# Patient Record
Sex: Male | Born: 1955 | Race: White | Hispanic: No | Marital: Married | Smoking: Never smoker
Health system: Southern US, Community
[De-identification: ages and names within clinical notes are randomized; demographics above are authoritative.]

## PROBLEM LIST (undated history)

## (undated) DIAGNOSIS — K635 Polyp of colon: Secondary | ICD-10-CM

## (undated) DIAGNOSIS — M1990 Unspecified osteoarthritis, unspecified site: Secondary | ICD-10-CM

## (undated) DIAGNOSIS — F32A Depression, unspecified: Secondary | ICD-10-CM

## (undated) DIAGNOSIS — I10 Essential (primary) hypertension: Secondary | ICD-10-CM

## (undated) DIAGNOSIS — K219 Gastro-esophageal reflux disease without esophagitis: Secondary | ICD-10-CM

## (undated) DIAGNOSIS — F329 Major depressive disorder, single episode, unspecified: Secondary | ICD-10-CM

## (undated) HISTORY — DX: Major depressive disorder, single episode, unspecified: F32.9

## (undated) HISTORY — DX: Depression, unspecified: F32.A

## (undated) HISTORY — DX: Gastro-esophageal reflux disease without esophagitis: K21.9

## (undated) HISTORY — PX: BACK SURGERY: SHX140

## (undated) HISTORY — PX: OTHER SURGICAL HISTORY: SHX169

## (undated) HISTORY — PX: NECK SURGERY: SHX720

## (undated) HISTORY — DX: Unspecified osteoarthritis, unspecified site: M19.90

## (undated) HISTORY — DX: Polyp of colon: K63.5

## (undated) HISTORY — DX: Essential (primary) hypertension: I10

---
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
## 1988-02-17 HISTORY — PX: LUMBAR SPINE SURGERY: SHX701

## 1997-02-16 HISTORY — PX: OTHER SURGICAL HISTORY: SHX169

## 1997-11-11 ENCOUNTER — Ambulatory Visit (HOSPITAL_COMMUNITY): Admission: RE | Admit: 1997-11-11 | Discharge: 1997-11-11 | Payer: Self-pay | Admitting: Internal Medicine

## 1997-11-11 ENCOUNTER — Encounter: Payer: Self-pay | Admitting: Internal Medicine

## 1997-12-20 ENCOUNTER — Inpatient Hospital Stay (HOSPITAL_COMMUNITY): Admission: RE | Admit: 1997-12-20 | Discharge: 1997-12-21 | Payer: Self-pay | Admitting: Neurosurgery

## 1997-12-20 ENCOUNTER — Encounter: Payer: Self-pay | Admitting: Neurosurgery

## 1998-01-17 ENCOUNTER — Ambulatory Visit (HOSPITAL_COMMUNITY): Admission: RE | Admit: 1998-01-17 | Discharge: 1998-01-17 | Payer: Self-pay | Admitting: Neurosurgery

## 1998-01-17 ENCOUNTER — Encounter: Payer: Self-pay | Admitting: Neurosurgery

## 1998-02-28 ENCOUNTER — Encounter: Admission: RE | Admit: 1998-02-28 | Discharge: 1998-05-02 | Payer: Self-pay | Admitting: Internal Medicine

## 1998-06-16 ENCOUNTER — Ambulatory Visit (HOSPITAL_COMMUNITY): Admission: RE | Admit: 1998-06-16 | Discharge: 1998-06-16 | Payer: Self-pay | Admitting: Psychiatry

## 1999-01-19 ENCOUNTER — Encounter: Payer: Self-pay | Admitting: Internal Medicine

## 1999-01-19 ENCOUNTER — Ambulatory Visit (HOSPITAL_COMMUNITY): Admission: RE | Admit: 1999-01-19 | Discharge: 1999-01-19 | Payer: Self-pay | Admitting: Internal Medicine

## 1999-02-17 HISTORY — PX: LUMBAR SPINE SURGERY: SHX701

## 1999-02-18 ENCOUNTER — Encounter: Payer: Self-pay | Admitting: Neurosurgery

## 1999-02-20 ENCOUNTER — Inpatient Hospital Stay (HOSPITAL_COMMUNITY): Admission: RE | Admit: 1999-02-20 | Discharge: 1999-02-21 | Payer: Self-pay | Admitting: Neurosurgery

## 1999-02-20 ENCOUNTER — Encounter: Payer: Self-pay | Admitting: Neurosurgery

## 1999-03-10 ENCOUNTER — Encounter: Payer: Self-pay | Admitting: Neurosurgery

## 1999-03-10 ENCOUNTER — Ambulatory Visit (HOSPITAL_COMMUNITY): Admission: RE | Admit: 1999-03-10 | Discharge: 1999-03-10 | Payer: Self-pay | Admitting: Neurosurgery

## 1999-06-06 ENCOUNTER — Encounter: Payer: Self-pay | Admitting: Neurosurgery

## 1999-06-06 ENCOUNTER — Ambulatory Visit (HOSPITAL_COMMUNITY): Admission: RE | Admit: 1999-06-06 | Discharge: 1999-06-06 | Payer: Self-pay | Admitting: Neurosurgery

## 1999-06-30 ENCOUNTER — Encounter: Payer: Self-pay | Admitting: Neurosurgery

## 1999-07-02 ENCOUNTER — Encounter: Payer: Self-pay | Admitting: Neurosurgery

## 1999-07-02 ENCOUNTER — Inpatient Hospital Stay (HOSPITAL_COMMUNITY): Admission: RE | Admit: 1999-07-02 | Discharge: 1999-07-03 | Payer: Self-pay | Admitting: Neurosurgery

## 1999-07-25 ENCOUNTER — Encounter: Payer: Self-pay | Admitting: Neurosurgery

## 1999-07-25 ENCOUNTER — Encounter: Admission: RE | Admit: 1999-07-25 | Discharge: 1999-07-25 | Payer: Self-pay | Admitting: Neurosurgery

## 1999-09-23 ENCOUNTER — Encounter: Admission: RE | Admit: 1999-09-23 | Discharge: 1999-09-23 | Payer: Self-pay | Admitting: Neurosurgery

## 1999-09-23 ENCOUNTER — Encounter: Payer: Self-pay | Admitting: Neurosurgery

## 1999-10-13 ENCOUNTER — Encounter: Payer: Self-pay | Admitting: Neurosurgery

## 1999-10-13 ENCOUNTER — Encounter: Admission: RE | Admit: 1999-10-13 | Discharge: 1999-10-13 | Payer: Self-pay | Admitting: Neurosurgery

## 1999-12-18 ENCOUNTER — Encounter: Admission: RE | Admit: 1999-12-18 | Discharge: 1999-12-18 | Payer: Self-pay | Admitting: Neurosurgery

## 1999-12-18 ENCOUNTER — Encounter: Payer: Self-pay | Admitting: Neurosurgery

## 2000-06-05 ENCOUNTER — Ambulatory Visit (HOSPITAL_COMMUNITY): Admission: RE | Admit: 2000-06-05 | Discharge: 2000-06-05 | Payer: Self-pay | Admitting: Neurosurgery

## 2000-06-05 ENCOUNTER — Encounter: Payer: Self-pay | Admitting: Neurosurgery

## 2000-10-24 ENCOUNTER — Ambulatory Visit (HOSPITAL_COMMUNITY): Admission: RE | Admit: 2000-10-24 | Discharge: 2000-10-24 | Payer: Self-pay | Admitting: Neurosurgery

## 2000-10-24 ENCOUNTER — Encounter: Payer: Self-pay | Admitting: Neurosurgery

## 2001-04-19 ENCOUNTER — Encounter: Payer: Self-pay | Admitting: Internal Medicine

## 2001-04-19 ENCOUNTER — Encounter: Admission: RE | Admit: 2001-04-19 | Discharge: 2001-04-19 | Payer: Self-pay | Admitting: Internal Medicine

## 2001-07-07 ENCOUNTER — Encounter: Payer: Self-pay | Admitting: Gastroenterology

## 2001-07-07 ENCOUNTER — Ambulatory Visit (HOSPITAL_COMMUNITY): Admission: RE | Admit: 2001-07-07 | Discharge: 2001-07-07 | Payer: Self-pay | Admitting: Gastroenterology

## 2001-10-05 ENCOUNTER — Encounter: Payer: Self-pay | Admitting: *Deleted

## 2001-10-05 ENCOUNTER — Encounter: Admission: RE | Admit: 2001-10-05 | Discharge: 2001-10-05 | Payer: Self-pay | Admitting: *Deleted

## 2003-10-28 ENCOUNTER — Ambulatory Visit (HOSPITAL_COMMUNITY): Admission: RE | Admit: 2003-10-28 | Discharge: 2003-10-28 | Payer: Self-pay | Admitting: Neurosurgery

## 2005-12-26 ENCOUNTER — Encounter: Admission: RE | Admit: 2005-12-26 | Discharge: 2005-12-26 | Payer: Self-pay | Admitting: Family Medicine

## 2007-05-04 ENCOUNTER — Emergency Department (HOSPITAL_COMMUNITY): Admission: EM | Admit: 2007-05-04 | Discharge: 2007-05-04 | Payer: Self-pay | Admitting: Emergency Medicine

## 2008-06-21 ENCOUNTER — Encounter: Admission: RE | Admit: 2008-06-21 | Discharge: 2008-06-21 | Payer: Self-pay | Admitting: Anesthesiology

## 2010-03-08 ENCOUNTER — Encounter: Payer: Self-pay | Admitting: Neurosurgery

## 2010-07-04 NOTE — Op Note (Signed)
[place]  Patient:    Jeffrey Yang, Jeffrey Yang Visit Number: [id number] MRN: [id number]          Service Type: END Location: ENDO Attending Physician:  Orland Mustard Dictated by:   Llana Aliment. Randa Evens, M.D. Proc. Date: 07/07/01 Admit Date:  07/07/2001 Discharge Date: 07/07/2001   CC:         Lilla Shook, M.D.   Operative Report  PROCEDURE:  Esophagogastroduodenoscopy.  MEDICATIONS:  Fentanyl 75 mcg, Versed 7 mg IV.  SCOPE:  Olympus scope.  INDICATION:  The patient is being treated by Dr. Rosalene Billings for chronic spinal pain and chronic pain syndrome; on chronic narcotics, oxycontin and oxycodone. He has had alot of nausea, vague symptoms, and severe constipation. He has fairly marked up epigastric pain. This has not really responded to proton pump inhibitor. This procedure is done to look for another reason.  DESCRIPTION OF PROCEDURE:  Procedure had been explained to the patient and consent obtained. The patient in the left lateral decubitus position, the Olympus video scope was inserted. Due to the patients chronic narcotic use, it was difficult to get him stable or able to do that and have him swallow the scope. The stomach was entered. Immediately upon entering the stomach, the greater curve was obscured by retained food material. We were able to pass this, and the duodenum, including the bulb and second portion, were seen well. The port channel was normal. The antrum was seen well and was normal. The majority of the greater curve was seen and was normal. No ulceration. There was again retained food material despite an overnight fast. Fundus and cardia were seen well and were normal. There was a 2-cm hiatal hernia. The distal and proximal esophagus were seen well upon removal and were normal. The patient tolerated the procedure fairly well.  ASSESSMENT:  Epigastric pain; more than likely due to gastroparesis from severe narcotics  use.  PLAN:  We will add Reglan in hopes of improving gastric emptying to see if this will help, and see back in the office in 2 months. Dictated by:   Llana Aliment. Randa Evens, M.D. Attending Physician:  Orland Mustard DD:  07/07/01 TD:  07/09/01 Job: [id number]

## 2010-07-04 NOTE — H&P (Signed)
[place]  Patient:    Jeffrey Yang                          MRN: [id number] Adm. Date:  [id number] Attending:  Barton Fanny CC:         [name], M.D. at office                         History and Physical  HISTORY OF PRESENT ILLNESS:  Patient is a 55 year old right-handed white male who is evaluated for a right L4-5 lumbar disk herniation.  He has a long history of  difficulties with his neck and his back.  He has undergone a right L5-S1 lumbar  laminotomy and diskectomy by Dr. Corlis Hove in January 1990.  He underwent a C6-7 anterior cervical diskectomy and fusion by Dr. [name] in 1994 and a C5-6 anterior cervical diskectomy and fusion by Dr. [name] in November 1999.  The patient explains that, as regards his low back, he had experienced periodic  lumbar sacral strains ever since his lumbar surgery 11 years ago.  He did have ome chronic numbness in the lateral aspect of his right thigh following that surgery 11 years ago.  Overall, his low back condition has been stable, though, up until about six months ago, when he was at the beach playing Frisbee with his son, he  tumbled over, landing on his buttocks, and since then he has had pain in his low back, down through his right lower extremity.  He describes pain in the right side of his low back into the lateral right hip and into the anterolateral right thigh, as well as pain to the right posterior thigh and calf and into the right foot towards the right great toe.  He has been treated with a variety of medications, including Darvocet, Vicodin, Flexeril, Neurontin, but the radicular pain continues.  The patient notes that he continues to have chronic neck pain and really had no  clinical improvement following his second cervical fusion.  He was discharged from Dr. [name] care, and continues under the care of his primary physician, Dr. [name],  as well as having undergone evaluation with Dr. [name].  He has been treated with Paxil for depression over the past couple of years, which he has used intermittently.  He also uses Neurontin intermittently.  PAST MEDICAL HISTORY:  Notable for history of hypertension, which has been treated the past couple of months, but which has been monitored for quite some time. He has a history of depression, TMJ, and colitis.  There is no history of myocardial infarction, cancer, stroke, diabetes, or lung disease.  PREVIOUS SURGERY:  Includes a right L5-S1 lumbar laminotomy and diskectomy by Dr. [name] in January 1990; a herniorrhaphy and vasectomy by Dr. [name] in February 1992; a C6-7 ACDF by Dr. [name] in February 1994; biopsy of a lump in his mouth that was benign in January 1999; knee surgery with Dr. [name] in March 1999; a C5-6 ACDF by Dr. [name] in November 1999.  ALLERGIES:  He has no allergies to medications, but he says that CODEINE often makes him sick.  He does tolerate hydrocodone.  CURRENT MEDICATIONS: 1. Diltiazem ER 180 mg q.d. for hypertension. 2. Flexeril 10 mg two at night for muscle spasms in his sleep. 3. Paxil 30 mg q.d. for depression. 4. Neurontin 300 mg t.i.d. for headaches  and neck pain. 5. Hydrocodone 2 a day for pain not relieved by Darvocet N-100, which he takes    about 2 per day for pain.  FAMILY HISTORY:  His father died of a brain tumor, his mother is in fair health at age 31 with diabetes, COPD, and hypertension.  There is a family history of diabetes, hypertension, brain tumors, and stroke.  SOCIAL HISTORY:  Patient is married.  He works as a Environmental manager. He does not smoke.  He drinks alcoholic beverages socially.  He denies a history of substance abuse.  REVIEW OF SYSTEMS:  Notable for those difficulties described in his history of present illness and past medical history.  Also had some chronic tinnitus  and some mild difficulty with urinary function,  although no incontinence.  His review of systems otherwise unremarkable.  PHYSICAL EXAMINATION:  GENERAL:  Patient is a well-developed, well-nourished white male in no acute distress.  VITAL SIGNS:  His height is 5 feet 7.5 inches, weight 169 pounds.  He is afebrile.  LUNGS:  Clear to auscultation.  He has symmetrical respiratory excursion.  HEART:  Regular rate and rhythm, normal S1 and S2, there is no murmur.  ABDOMEN:  Soft, nondistended, bowel sounds present.  EXTREMITIES:  Show no clubbing, cyanosis, or edema.  MUSCULOSKELETAL:  Shows no tenderness to palpation of the lumbar spinous process or para lumbar musculature.  However, he does have discomfort with pulling in the posterior thighs, with flexion about 70 degrees.  He has some discomfort in the low back with extension.  Straight leg raising is negative bilaterally, but he does  produce some pulling in the right hamstrings at about 70-80 degrees.  NEUROLOGIC:  Shows 5/5 strength through the lower extremities including the iliopsoas, quadriceps, dorsiflexor, plantar flexor, and extensor hallucis longus. Sensation is intact to pinprick in both the upper and lower extremities. Reflexes show the biceps are minimal bilaterally.  The left brachioradialis is minimal, he right is trace.  The triceps are 1 bilaterally.  The quadriceps and gastrocnemii are 2 bilaterally.  Toes are downgoing bilaterally.  He has a normal gait and stance.  DIAGNOSTIC STUDIES:  AP and lateral lumbar spine x-rays show disk space narrowing and spurring at L4-5 and L5-S1, and to a lessor extent at L3-4, consistent with  degenerative disk disease.  MRI scan done at [facility] without and with gandolinium shows evidence of degenerative disk disease, multi-level, including  L3-4, L4-5, and L5-S1, with minimal degenerative changes at L2-3.  At L3-4 there is no significant ______ root  compression.  At L4-5, there is a disk herniation located in the right ventral epidural space with circumferential disk bulging and spondylitic ridging circumferentially.  At L5-S1, there is a previous right L5-S1  lumbar laminotomy and circumferential degenerative disk bulging and spondylitic  ridging circumferentially, but without distinct disk herniation.  IMPRESSION: 1.  Patient with chronic degenerative changes, both in neck and low back, with    chronic neck and low back pain, and difficulties with depression that is most    likely related to those to some extent. 2. Subacute right lumbar radiculopathy, primarily due to a right L4-5 lumbar disk    herniation.  PLAN:  Patient will be admitted for a right L4-5 lumbar laminotomy and microdiskectomy.  We discussed the goal of such a surgery, that being relief of his right lumbar radiculopathy and ______ through his right posterior thigh and calf and into the right foot and great toe.  However, his underlying neck pain and back pain most likely will persist.  We discussed the nature of the surgical procedure, typical length of surgery, hospital stay, and overall recuperation; and risks of surgery including the risk of infection, bleeding, possible need for transfusion; the risk of nerve root dysfunction with pain, weakness, numbness, or paresthesias; the risk of recurrent disk herniation; possible need for further surgery; and anesthetic risks of myocardial infarction, stroke, pneumonia, and death. Understanding all this, they do wish to proceed with surgery.  The patient is also scheduled for an appointment next month with Dr. Thyra Breed at the pain management clinic at [facility] and I encouraged him to keep that appointment so that they will be able to work on his chronic pain syndrome  involving his neck and back. DD:  02/20/99 TD:  02/20/99 Job: [id number]

## 2010-07-04 NOTE — Op Note (Signed)
[place]  Patient:    Jeffrey Yang, Jeffrey Yang                         MRN: [id number] Proc. Date: 07/02/99 Adm. Date:  [id number] Disc. Date: [id number] Attending:  Barton Fanny CC:         [name], M.D.                           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  OPERATION PERFORMED:  C5 to C7 posterior cervical arthrodesis with Axis lateral mass plates and secure strand interspinous wiring and Osteoblast allograft.  SURGEON:  [name], M.D.  ASSISTANT:  Danae Orleans. Venetia Maxon, M.D.  ANESTHESIA:  General endotracheal.  INDICATIONS FOR PROCEDURE:  The patient is a 55 year old man who has previously undergone a C6-7 anterior cervical diskectomy and fusion and then a C5-6 anterior cervical diskectomy and fusion, both by another physician and both which resulted in nonunion and pseudoarthrososis with chronic neck pain. A decision was then made to proceed with a posterior cervical arthrodesis.  DESCRIPTION OF PROCEDURE:  The patient was brought to the operating room and placed under general endotracheal anesthesia.  The patient was placed in a three-pin Mayfield headholder and turned to a prone position.  The surgical area was shaved and then prepped with Betadine soap and solution and draped in sterile fashion.  The midline was infiltrated with local anesthetic with epinephrine and a midline incision was made and carried down through the subcutaneous tissues.  Bipolar cautery and electrocautery were used to maintain hemostasis.  Dissection was carried down to the posterior cervical fascia which was incised bilaterally and the paracervical musculature was dissected in a subperiosteal fashion.  Self-retaining retractors were placed and x-ray was taken and the C5, C6 and C7 spinous processes and laminae  were identified.  Dissection was carried out laterally exposing the C5-6 and C6-7 facet joints bilaterally.  The facet joints were scraped clean of soft tissue using microcurets and then using the A-2 Midas Rex drill, the cartilaginous surfaces were cleaned of cartilaged and again the cartilaginous surfaces were further curetted using microcurets.  We then selected a six-hole 15 mm Axis plate.  It was cut in half and contoured to the cervical lordosis.  Using the Midas Rex drill with a dental attachment, we were able to make holes through the spinous processes of C5, 6 and 7 and then the lateral mass plates were positioned over the lateral masses bilaterally using an awl.  A pilot hole was placed 1 mm medial to the midpoint of the facet and then using the drill guide and the drill fit at 14 mm, screw holes were placed in each of the lateral masses in a trajectory 30 degrees superiorly and 30 degrees laterally.  The cortical surfaces were tapped and then 14 mm cancellous screws placed.  The plates were secured down and all six screws were tightened.  An x-ray was taken.  The alignment was good placing screws in good position.  Then using secure strand with an interspinous needle, we were able to pass a secure strand through each of the spinous processes of C5, C6 and C7 in a figure-of-eight fashion and they were then secured with tensioner and a secure knot placed.  Prior to placing the  lateral mass plates each of the facet joints was packed with Osteoblast gel and then after the fusion construct was completed, additional Osteoblast gel was placed over the lamina and facets after they had been decorticated with an A-2 bur.  The wound was then closed in multiple layers after hemostasis was established.  The deep fascia was closed with interrupted undyed 0 Vicryl sutures and subcutaneous and subcuticular layer were closed with interrupted undyed 0 and 2-0 undyed Vicryl sutures.  The skin edges  were approximated with Dermabond.  Following surgery, the patient was turned back to a supine position and the three pin Mayfield head holder was removed.  The patient was to be reversed from anesthetic, extubated and transferred to recovery room for further care.  Estimated blood loss for this procedure was 75 cc.  Sponge count correct. DD:  07/02/99 TD:  07/07/99 Job: [id number]

## 2010-07-04 NOTE — H&P (Signed)
[place]. [facility]  Patient:    Jeffrey Yang, Jeffrey Yang                         MRN: [id number] Adm. Date:  [id number] Attending:  Barton Fanny CC:         [name], M.D.                         History and Physical  CHIEF COMPLAINT: The patient is a 55 year old right-handed white male who has been a patient of mine for a number of years, admitted now for failed C5-6 and C6-7 anterior cervical diskectomy and fusions with development of pseudoarthrosis and chronic neck pain.  He is admitted now for posterior cervical fusion.  HISTORY OF PRESENT ILLNESS: His history dates back to January 1990 when he underwent right L5-S1 lumbar laminotomy and diskectomy by Dr. Corlis Hove.  He did well from that surgery.  Subsequently he came under the care of Dr. Aliene Beams because of insurance considerations and the patient underwent a C6-7 anterior cervical diskectomy and fusion in 1994. Unfortunately, he had enduring neck pain and subsequently underwent a C5-6 anterior cervical diskectomy and fusion by Dr. [name] in November of 1999. Notably, following that surgery he continued to have neck pain.  He was promptly released by Dr. [name], having been told that there was nothing further that could be done for him.  His primary physician, Dr. [name], then referred him to Dr. [name], and he has been under treatment for depression and chronic pain since that time, and currently has been waiting nearly a year for an appointment for pain management with Dr. Thyra Breed.  His insurance carrier changed and he returned or care in our practice in December 2000, with a subacute right lumbar radiculopathy that was found to be due to a right L4-5 lumbar disk herniation, and he underwent a right L4-5 lumbar laminotomy and microdiskectomy in January 2001, from which he recovered well.  However, he has continued to have chronic neck pain and  therefore his cervical spine was evaluated.  Cervical spine x-rays with flexion and extension views revealed that in fact he had a nonunion and pseudoarthrosis at both the C5-6 and C6-7 levels.  This was evidenced by splaying of the spinous processes between flexion and extension and fracture of the two screws at the lower end of the C5-6 anterior cervical plate; i.e., the screws in the C6 vertebra.  It was felt that the patient had a nonunion and pseudoarthrosis at both the C5-6 and C6-7 levels.  He was evaluated further with cervical MRI scan and cervical myelogram and post myelogram CT scan.  There were degenerative changes seen in the cervical spine but there was no significant thecal sac, spinal cord, or nerve root compression at any level.  The decision was made to proceed with a posterior cervical arthrodesis, specifically a C5 to C7 posterior cervical arthrodesis with axis plates, lateral mass screws, allograft, and intraspinous fixation, and the patient is now admitted for such.  PAST MEDICAL HISTORY:  1. Notable for a history of hypertension.  Medications have been adjusted by     Dr. Prentiss Bells over the past several months.  2. History of depression due to chronic pain.  3. TMJ syndrome.  4. Colitis.  He has no history of myocardial infarction, cancer, stroke, diabetes, or lung disease.  PAST SURGICAL HISTORY:  1.  Right L5-S1 lumbar laminotomy and diskectomy in January 1990.  2. Herniorrhaphy and vasectomy in February 1992.  3. C6-7 ACDF in February 1994.  4. Biopsy of lump in the mouth that was benign in January 1999.  5. Knee surgery in March 1999.  6. C5-6 ACDF in November of 1999.  7. Right L4-5 lumbar laminotomy and microdiskectomy in January 2001.  ALLERGIES: He has no known allergies but says that codeine often makes him sick.  He does tolerate hydrocodone.  CURRENT MEDICATIONS:  1. Paxil 30 mg q.d.  2. Hyzaar 50 mg q.d.  3. Temazepam q.h.s. p.r.n. for  sleep.  4. Hydrocodone for pain.  FAMILY HISTORY: Father died of a brain tumor.  His mother is in fair health at age 8 with diabetes, COPD, and hypertension.  There is a family history of diabetes, hypertension, brain tumors, and strokes.  SOCIAL HISTORY: The patient is married.  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  He does not smoke.  He drinks alcoholic beverages socially.  He denies history of substance abuse.  REVIEW OF SYSTEMS: Notable for those difficulties described in his History of Present Illness and Past Medical History.  He also notes a history of chronic tinnitus, some mild difficulties with urinary function but no incontinence. His Review Of Systems is otherwise unremarkable.  PHYSICAL EXAMINATION:  GENERAL: The patient is a well-developed, well-nourished white male in no acute distress.  VITAL SIGNS: Temperature 97.5 degrees, pulse 89, blood pressure 115/74, respiratory rate 18.  Height 5 feet 7 inches.  Weight 175 pounds.  LUNGS: Clear to auscultation with symmetrical respiratory excursion.  HEART: Regular rate and rhythm with normal S1 and S2.  No murmur.  ABDOMEN: Soft, nondistended.  Bowel sounds present.  EXTREMITIES: No clubbing, cyanosis, or edema.  NEUROLOGIC: Strength is 5/5 in the upper and lower extremities.  Sensation is intact to pinprick throughout the upper and lower extremities.  Reflexes at the biceps are minimal bilaterally, the left brachial radialis is minimal and the right is trace, the triceps are 1 bilaterally, the quadriceps and gastrocnemius are 2 bilaterally.  Toes are downgoing bilaterally.  He has normal gait  and stance.  DIAGNOSTIC STUDIES: Cervical spine x-rays with flexion and extension views  show splaying of the C5-6 and C6-7 spinous processes, with fracture of the inferior screws of the C5-6 plate; that is, the screws in the C6 vertebra. MRI, myelogram, and post myelogram CT scans do not show evidence of thecal sac, spinal cord, or nerve root compression at any level.  IMPRESSION: Failed fusion at C5-6 and C6-7 with nonunion and pseudoarthrosis and chronic neck pain.  PLAN: The patient is being admitted for a posterior cervical fusion, specifically a C5 to C7 posterior cervical arthrodesis with axis plates, lateral mass screws, and allograft with intraspinous fixation.  I have discussed with the patient and his wife on several occasions the nature of the surgical procedure, typical length of surgery, hospital stay, and overall recuperation, limitations during the postoperative period, and risks of surgery including risk of infection, bleeding, possible need for transfusion, risk of nerve root dysfunction, pain, weakness, numbness, or paresthesias, risk of  nerve root dysfunction with spinal cord dysfunction with weakness, numbness, or pain, and paralysis, as well as risk of failure with arthrodesis and nonunion, and anesthetic risks of myocardial infarction, stroke, pneumonia, and death.  Understanding all this the patient does wish to proceed with surgery, and is admitted for such. DD:  07/02/99 TD:  07/02/99 Job: [id number]

## 2014-02-26 DIAGNOSIS — I10 Essential (primary) hypertension: Secondary | ICD-10-CM | POA: Diagnosis not present

## 2014-02-26 DIAGNOSIS — R739 Hyperglycemia, unspecified: Secondary | ICD-10-CM | POA: Diagnosis not present

## 2014-03-12 DIAGNOSIS — I10 Essential (primary) hypertension: Secondary | ICD-10-CM | POA: Diagnosis not present

## 2014-03-12 DIAGNOSIS — R739 Hyperglycemia, unspecified: Secondary | ICD-10-CM | POA: Diagnosis not present

## 2014-09-03 DIAGNOSIS — R739 Hyperglycemia, unspecified: Secondary | ICD-10-CM | POA: Diagnosis not present

## 2014-09-03 DIAGNOSIS — Z0001 Encounter for general adult medical examination with abnormal findings: Secondary | ICD-10-CM | POA: Diagnosis not present

## 2014-09-03 DIAGNOSIS — I10 Essential (primary) hypertension: Secondary | ICD-10-CM | POA: Diagnosis not present

## 2014-09-10 DIAGNOSIS — Z0000 Encounter for general adult medical examination without abnormal findings: Secondary | ICD-10-CM | POA: Diagnosis not present

## 2014-09-10 DIAGNOSIS — Z125 Encounter for screening for malignant neoplasm of prostate: Secondary | ICD-10-CM | POA: Diagnosis not present

## 2015-03-12 DIAGNOSIS — I10 Essential (primary) hypertension: Secondary | ICD-10-CM | POA: Diagnosis not present

## 2015-03-12 DIAGNOSIS — Z125 Encounter for screening for malignant neoplasm of prostate: Secondary | ICD-10-CM | POA: Diagnosis not present

## 2015-03-15 DIAGNOSIS — R739 Hyperglycemia, unspecified: Secondary | ICD-10-CM | POA: Diagnosis not present

## 2015-03-15 DIAGNOSIS — E785 Hyperlipidemia, unspecified: Secondary | ICD-10-CM | POA: Diagnosis not present

## 2015-03-15 DIAGNOSIS — F1120 Opioid dependence, uncomplicated: Secondary | ICD-10-CM | POA: Diagnosis not present

## 2015-03-15 DIAGNOSIS — I10 Essential (primary) hypertension: Secondary | ICD-10-CM | POA: Diagnosis not present

## 2015-03-15 DIAGNOSIS — Z5181 Encounter for therapeutic drug level monitoring: Secondary | ICD-10-CM | POA: Diagnosis not present

## 2015-05-16 DIAGNOSIS — M25561 Pain in right knee: Secondary | ICD-10-CM | POA: Diagnosis not present

## 2015-09-06 DIAGNOSIS — I10 Essential (primary) hypertension: Secondary | ICD-10-CM | POA: Diagnosis not present

## 2015-10-17 DIAGNOSIS — R739 Hyperglycemia, unspecified: Secondary | ICD-10-CM | POA: Diagnosis not present

## 2015-10-17 DIAGNOSIS — Z125 Encounter for screening for malignant neoplasm of prostate: Secondary | ICD-10-CM | POA: Diagnosis not present

## 2015-10-17 DIAGNOSIS — Z23 Encounter for immunization: Secondary | ICD-10-CM | POA: Diagnosis not present

## 2015-10-17 DIAGNOSIS — I10 Essential (primary) hypertension: Secondary | ICD-10-CM | POA: Diagnosis not present

## 2016-12-01 DIAGNOSIS — M5412 Radiculopathy, cervical region: Secondary | ICD-10-CM | POA: Diagnosis not present

## 2016-12-01 DIAGNOSIS — M6208 Separation of muscle (nontraumatic), other site: Secondary | ICD-10-CM | POA: Diagnosis not present

## 2016-12-01 DIAGNOSIS — Z23 Encounter for immunization: Secondary | ICD-10-CM | POA: Diagnosis not present

## 2016-12-18 DIAGNOSIS — M5412 Radiculopathy, cervical region: Secondary | ICD-10-CM | POA: Diagnosis not present

## 2016-12-18 DIAGNOSIS — M5030 Other cervical disc degeneration, unspecified cervical region: Secondary | ICD-10-CM | POA: Diagnosis not present

## 2016-12-18 DIAGNOSIS — R29898 Other symptoms and signs involving the musculoskeletal system: Secondary | ICD-10-CM | POA: Diagnosis not present

## 2016-12-18 DIAGNOSIS — Z981 Arthrodesis status: Secondary | ICD-10-CM | POA: Diagnosis not present

## 2016-12-18 DIAGNOSIS — M4722 Other spondylosis with radiculopathy, cervical region: Secondary | ICD-10-CM | POA: Diagnosis not present

## 2016-12-24 DIAGNOSIS — M4802 Spinal stenosis, cervical region: Secondary | ICD-10-CM | POA: Diagnosis not present

## 2016-12-24 DIAGNOSIS — M5412 Radiculopathy, cervical region: Secondary | ICD-10-CM | POA: Diagnosis not present

## 2016-12-30 DIAGNOSIS — Z981 Arthrodesis status: Secondary | ICD-10-CM | POA: Diagnosis not present

## 2016-12-30 DIAGNOSIS — R29898 Other symptoms and signs involving the musculoskeletal system: Secondary | ICD-10-CM | POA: Diagnosis not present

## 2016-12-30 DIAGNOSIS — M4722 Other spondylosis with radiculopathy, cervical region: Secondary | ICD-10-CM | POA: Diagnosis not present

## 2016-12-30 DIAGNOSIS — M5412 Radiculopathy, cervical region: Secondary | ICD-10-CM | POA: Diagnosis not present

## 2016-12-30 DIAGNOSIS — G5622 Lesion of ulnar nerve, left upper limb: Secondary | ICD-10-CM | POA: Diagnosis not present

## 2017-11-22 DIAGNOSIS — Z79899 Other long term (current) drug therapy: Secondary | ICD-10-CM | POA: Diagnosis not present

## 2017-11-22 DIAGNOSIS — M5030 Other cervical disc degeneration, unspecified cervical region: Secondary | ICD-10-CM | POA: Diagnosis not present

## 2017-11-22 DIAGNOSIS — I10 Essential (primary) hypertension: Secondary | ICD-10-CM | POA: Diagnosis not present

## 2017-11-22 DIAGNOSIS — G4700 Insomnia, unspecified: Secondary | ICD-10-CM | POA: Diagnosis not present

## 2017-12-22 ENCOUNTER — Encounter: Payer: Self-pay | Admitting: Family Medicine

## 2017-12-22 ENCOUNTER — Ambulatory Visit (INDEPENDENT_AMBULATORY_CARE_PROVIDER_SITE_OTHER): Payer: Medicare Other

## 2017-12-22 ENCOUNTER — Ambulatory Visit (INDEPENDENT_AMBULATORY_CARE_PROVIDER_SITE_OTHER): Payer: Medicare Other | Admitting: Family Medicine

## 2017-12-22 ENCOUNTER — Encounter: Payer: Self-pay | Admitting: Lab

## 2017-12-22 VITALS — BP 132/84 | HR 78 | Temp 98.6°F | Ht 67.0 in | Wt 187.0 lb

## 2017-12-22 DIAGNOSIS — M19071 Primary osteoarthritis, right ankle and foot: Secondary | ICD-10-CM | POA: Diagnosis not present

## 2017-12-22 DIAGNOSIS — I10 Essential (primary) hypertension: Secondary | ICD-10-CM | POA: Diagnosis not present

## 2017-12-22 DIAGNOSIS — M542 Cervicalgia: Secondary | ICD-10-CM

## 2017-12-22 DIAGNOSIS — M79671 Pain in right foot: Secondary | ICD-10-CM | POA: Diagnosis not present

## 2017-12-22 DIAGNOSIS — M79672 Pain in left foot: Secondary | ICD-10-CM | POA: Diagnosis not present

## 2017-12-22 DIAGNOSIS — M19072 Primary osteoarthritis, left ankle and foot: Secondary | ICD-10-CM | POA: Diagnosis not present

## 2017-12-22 DIAGNOSIS — M5442 Lumbago with sciatica, left side: Secondary | ICD-10-CM | POA: Diagnosis not present

## 2017-12-22 DIAGNOSIS — G4700 Insomnia, unspecified: Secondary | ICD-10-CM | POA: Diagnosis not present

## 2017-12-22 DIAGNOSIS — G8929 Other chronic pain: Secondary | ICD-10-CM

## 2017-12-22 DIAGNOSIS — M5441 Lumbago with sciatica, right side: Secondary | ICD-10-CM

## 2017-12-22 DIAGNOSIS — M545 Low back pain, unspecified: Secondary | ICD-10-CM | POA: Insufficient documentation

## 2017-12-22 MED ORDER — CLONAZEPAM 0.5 MG PO TABS
0.5000 mg | ORAL_TABLET | Freq: Every day | ORAL | 2 refills | Status: DC
Start: 2017-12-22 — End: 2018-03-24

## 2017-12-22 NOTE — Progress Notes (Signed)
Subjective:    Patient ID: Jeffrey Yang, male    DOB: 1955/09/29, 62 y.o.   MRN: [id number]  HPI   Patient presents to clinic to establish primary care with new PCP.  He recently moved to the area from [place].  Patient takes medication to control blood pressure.  He also has chronic neck low back and leg pain.  Patient has had multiple back surgeries.  Uses meloxicam and Flexeril to help keep back, neck and leg pain under control.  Patient has taken clonazepam 0.5 mg once daily for many years to help him sleep.  Patient's main complaint today is bilateral foot pain with knot-like areas on the bottoms of both feet.  Patient states the pain in the feet has been present for months.  Patient did have blood work last month at his previous PCP in [place], we will have him sign medical release so we can get these records.  Past Medical History:  Diagnosis Date  . Arthritis   . Depression   . GERD (gastroesophageal reflux disease)   . Hypertension   . Polyp of colon    Social History   Tobacco Use  . Smoking status: Never Smoker  . Smokeless tobacco: Never Used  Substance Use Topics  . Alcohol use: Yes   Past Surgical History:  Procedure Laterality Date  . OTHER SURGICAL HISTORY    . TONSILLECTOMY  1965   Family History  Problem Relation Age of Onset  . Hypertension Father    Review of Systems  Constitutional: Negative for chills, fatigue and fever.  HENT: Negative for congestion, ear pain, sinus pain and sore throat.   Eyes: Negative.   Respiratory: Negative for cough, shortness of breath and wheezing.   Cardiovascular: Negative for chest pain, palpitations and leg swelling.  Gastrointestinal: Negative for abdominal pain, diarrhea, nausea and vomiting.  Genitourinary: Negative for dysuria, frequency and urgency.  Musculoskeletal: Chronic neck, back, leg pain. Pain on bottoms of both feet.  Skin: Negative for color change, pallor and rash.  Neurological: Negative  for syncope, light-headedness and headaches.  Psychiatric/Behavioral: The patient is not nervous/anxious.       Objective:   Physical Exam  Constitutional: He appears well-developed and well-nourished. No distress.  HENT: Normocephalic. Conjunctivae and EOM are normal. No scleral icterus.  Neck: Stiff ROM related to pain.Neck supple. No tracheal deviation present.  Cardiovascular: Normal rate, regular rhythm and normal heart sounds.  Pulmonary/Chest: Effort normal and breath sounds normal. No respiratory distress. He has no wheezes. He has no rales.  Abdominal: Soft. Bowel sounds are normal. There is no tenderness.  Neurological: He is alert and oriented to person, place, and time. Gait normal  Musculoskeletal: Pain on bottoms of both feet.  There are small knot-like areas on bottoms of both feet - ?nodule/fibroma Skin: Skin is warm and dry. He is not diaphoretic. No pallor.  Psychiatric: He has a normal mood and affect. His behavior is normal. Thought content normal.  Nursing note and vitals reviewed.    Vitals:   12/22/17 1008  BP: 132/84  Pulse: 78  Temp: 98.6 F (37 C)  SpO2: 93%   Assessment & Plan:   Bilateral foot pain-we will get x-rays of both feet to further investigate pain and not areas.  Once we have x-ray results, we will better be able to determine next step in plan of care.  Discussed possible referral to podiatry.  Insomnia-refill of clonazepam given.  [facility] PMP registry checked and  is appropriate for refill.  Patient also signed narcotic agreement and this will be scanned into chart.  Essential hypertension-blood pressure controlled on current medication regimen.  We will continue to monitor blood pressure readings.  Chronic low back and chronic neck pain- patient will continue meloxicam and cyclobenzaprine to help keep pain under control.  Follow-up here in 3 months for recheck on chronic conditions.  We will plan to do new blood work at next  visit.

## 2017-12-23 ENCOUNTER — Encounter: Payer: Self-pay | Admitting: *Deleted

## 2017-12-23 ENCOUNTER — Telehealth: Payer: Self-pay | Admitting: Family Medicine

## 2017-12-23 ENCOUNTER — Encounter: Payer: Self-pay | Admitting: Family Medicine

## 2017-12-23 NOTE — Telephone Encounter (Signed)
Left message on voicemail for pt to return call to office for results.    

## 2017-12-23 NOTE — Telephone Encounter (Signed)
Pt returning call for lab results  

## 2017-12-23 NOTE — Telephone Encounter (Signed)
This encounter was created in error - please disregard.

## 2017-12-23 NOTE — Telephone Encounter (Signed)
Copied from [place] [id number]. Topic: Quick Communication - Lab Results (Clinic Use ONLY) >> Dec 23, 2017 11:30 AM Neta Ehlers, RMA wrote: Called patient to inform them of 12/22/2017 lab results. When patient returns call, triage nurse may disclose results.

## 2018-03-24 ENCOUNTER — Ambulatory Visit (INDEPENDENT_AMBULATORY_CARE_PROVIDER_SITE_OTHER): Payer: Medicare Other | Admitting: Family Medicine

## 2018-03-24 ENCOUNTER — Encounter: Payer: Self-pay | Admitting: Family Medicine

## 2018-03-24 VITALS — BP 124/76 | HR 74 | Temp 98.0°F | Resp 16 | Ht 67.0 in | Wt 187.0 lb

## 2018-03-24 DIAGNOSIS — I10 Essential (primary) hypertension: Secondary | ICD-10-CM

## 2018-03-24 DIAGNOSIS — M79671 Pain in right foot: Secondary | ICD-10-CM

## 2018-03-24 DIAGNOSIS — Z1159 Encounter for screening for other viral diseases: Secondary | ICD-10-CM

## 2018-03-24 DIAGNOSIS — M79672 Pain in left foot: Secondary | ICD-10-CM | POA: Diagnosis not present

## 2018-03-24 DIAGNOSIS — G4700 Insomnia, unspecified: Secondary | ICD-10-CM

## 2018-03-24 LAB — COMPREHENSIVE METABOLIC PANEL
ALT: 16 U/L (ref 0–53)
AST: 17 U/L (ref 0–37)
Albumin: 4.5 g/dL (ref 3.5–5.2)
Alkaline Phosphatase: 84 U/L (ref 39–117)
BUN: 15 mg/dL (ref 6–23)
CHLORIDE: 103 meq/L (ref 96–112)
CO2: 28 mEq/L (ref 19–32)
Calcium: 9.7 mg/dL (ref 8.4–10.5)
Creatinine, Ser: 1.21 mg/dL (ref 0.40–1.50)
GFR: 60.67 mL/min (ref >60.00)
GLUCOSE: 136 mg/dL — AB (ref 70–99)
Potassium: 4.7 mEq/L (ref 3.5–5.1)
Sodium: 137 mEq/L (ref 135–145)
TOTAL PROTEIN: 7.3 g/dL (ref 6.0–8.3)
Total Bilirubin: 0.6 mg/dL (ref 0.2–1.2)

## 2018-03-24 LAB — CBC
HEMATOCRIT: 49.6 % (ref 39.0–52.0)
HEMOGLOBIN: 17.3 g/dL — AB (ref 13.0–17.0)
MCHC: 35 g/dL (ref 30.0–36.0)
MCV: 92.8 fl (ref 78.0–100.0)
PLATELETS: 282 10*3/uL (ref 150.0–400.0)
RBC: 5.34 Mil/uL (ref 4.22–5.81)
RDW: 13.3 % (ref 11.5–15.5)
WBC: 8.8 10*3/uL (ref 4.0–10.5)

## 2018-03-24 LAB — LIPID PANEL
CHOL/HDL RATIO: 6
Cholesterol: 210 mg/dL — ABNORMAL HIGH (ref 0–200)
HDL: 37.5 mg/dL — ABNORMAL LOW (ref >39.00)
NONHDL: 172.16
Triglycerides: 217 mg/dL — ABNORMAL HIGH (ref 0.0–149.0)
VLDL: 43.4 mg/dL — AB (ref 0.0–40.0)

## 2018-03-24 LAB — LDL CHOLESTEROL, DIRECT: Direct LDL: 140 mg/dL

## 2018-03-24 LAB — HEMOGLOBIN A1C: HEMOGLOBIN A1C: 5.9 % (ref 4.6–6.5)

## 2018-03-24 MED ORDER — CLONAZEPAM 0.5 MG PO TABS: 0.5000 mg | ORAL_TABLET | Freq: Every day | ORAL | 2 refills | Status: DC

## 2018-03-24 NOTE — Progress Notes (Signed)
Subjective:    Patient ID: Jeffrey Yang, male    DOB: 12-10-1955, 63 y.o.   MRN: [id number]  HPI   Patient presents to clinic for 99-month follow-up.  Blood pressure remains stable on Bystolic and amlodipine.  Is not having any adverse effects of these medications.  Denies any chest pain, palpitations or lower extremity swelling.  Patient continues to have issues with insomnia.  He has been on clonazepam 0.5 mg once daily prior to bedtime for over 20 years.  Patient states without this medication, feels like he cannot get to sleep.  States previous providers have tried to wean him down in the past, but he was always unsuccessful.  Patient continues to have bilateral foot pain.  X-rays done at previous visit did show some arthritis.  Patient tried anti-inflammatory medications without much effect.    Patient Active Problem List   Diagnosis Date Noted  . Essential hypertension 12/22/2017  . Foot pain, bilateral 12/22/2017  . Insomnia 12/22/2017  . Chronic bilateral low back pain with bilateral sciatica 12/22/2017  . Chronic neck pain 12/22/2017   Social History   Tobacco Use  . Smoking status: Never Smoker  . Smokeless tobacco: Never Used  Substance Use Topics  . Alcohol use: Yes   Review of Systems  Constitutional: Negative for chills, fatigue and fever.  HENT: Negative for congestion, ear pain, sinus pain and sore throat.   Eyes: Negative.   Respiratory: Negative for cough, shortness of breath and wheezing.   Cardiovascular: Negative for chest pain, palpitations and leg swelling.  Gastrointestinal: Negative for abdominal pain, diarrhea, nausea and vomiting.  Genitourinary: Negative for dysuria, frequency and urgency.  Musculoskeletal: +bilat foot pain  Skin: Negative for color change, pallor and rash.  Neurological: Negative for syncope, light-headedness and headaches.  Psychiatric/Behavioral: The patient is not nervous/anxious. +insomnia      Objective:   Physical  Exam  Constitutional: He appears well-developed and well-nourished. No distress.  HENT:  Head: Normocephalic and atraumatic.  Eyes: Pupils are equal, round, and reactive to light. Conjunctivae and EOM are normal. No scleral icterus.  Neck: Normal range of motion. Neck supple. No tracheal deviation present.  Cardiovascular: Normal rate, regular rhythm and normal heart sounds. No LE edema. No carotid bruit.  Pulmonary/Chest: Effort normal and breath sounds normal. No respiratory distress. He has no wheezes. He has no rales.  Neurological: He is alert and oriented to person, place, and time.  Gait normal  Skin: Skin is warm and dry. He is not diaphoretic. No pallor.  Psychiatric: He has a normal mood and affect. His behavior is normal.   Nursing note and vitals reviewed.   Vitals:   03/24/18 1001  BP: 124/76  Pulse: 74  Resp: 16  Temp: 98 F (36.7 C)  SpO2: 98%      Assessment & Plan:    Essential hypertension-stable on amlodipine and Bystolic.  Discussed healthy diet including lean proteins, vegetables, low carb low sugar lots of water monitoring salt intake and regular exercise.  Insomnia-patient has been on clonazepam for many years without adverse effect.  His dose is low at 0.5 mg.  We will refill clonazepam.    Bilateral foot pain - due to continuance of pain we will refer to podiatry for further evaluation.  We will get fasting lab work today in office and also include hepatitis C screening labs.  He will follow-up in 3 months for recheck on chronic medical conditions.  Advised to return to clinic  sooner if any issues arise.

## 2018-03-25 LAB — THYROID PANEL WITH TSH
Free Thyroxine Index: 2.9 (ref 1.4–3.8)
T3 Uptake: 31 % (ref 22–35)
T4 TOTAL: 9.2 ug/dL (ref 4.9–10.5)
TSH: 1.73 mIU/L (ref 0.40–4.50)

## 2018-03-25 LAB — HEPATITIS C ANTIBODY
Hepatitis C Ab: NONREACTIVE
SIGNAL TO CUT-OFF: 0.03 (ref <1.00)

## 2018-03-29 ENCOUNTER — Telehealth: Payer: Self-pay | Admitting: Family Medicine

## 2018-03-29 ENCOUNTER — Other Ambulatory Visit: Payer: Self-pay

## 2018-03-29 DIAGNOSIS — M5441 Lumbago with sciatica, right side: Secondary | ICD-10-CM

## 2018-03-29 DIAGNOSIS — M5442 Lumbago with sciatica, left side: Secondary | ICD-10-CM

## 2018-03-29 DIAGNOSIS — G8929 Other chronic pain: Secondary | ICD-10-CM

## 2018-03-29 DIAGNOSIS — I10 Essential (primary) hypertension: Secondary | ICD-10-CM

## 2018-03-29 MED ORDER — CYCLOBENZAPRINE HCL 10 MG PO TABS: 5.0000 mg | ORAL_TABLET | Freq: Three times a day (TID) | ORAL | 2 refills | Status: DC | PRN

## 2018-03-29 MED ORDER — AMLODIPINE BESYLATE 10 MG PO TABS: 10.0000 mg | ORAL_TABLET | Freq: Every day | ORAL | 3 refills | Status: DC

## 2018-03-29 NOTE — Telephone Encounter (Signed)
Patient notified

## 2018-03-29 NOTE — Telephone Encounter (Signed)
Rx sent in

## 2018-03-29 NOTE — Telephone Encounter (Signed)
Copied from [place] [id number]. Topic: Quick Communication - See Telephone Encounter >> Mar 29, 2018 11:01 AM Conception Chancy, NT wrote: CRM for notification. See Telephone encounter for: 03/29/18.  Patient is calling and requesting a refill on Cyclobenzaprine (Flexeril) 10mg  and amLODipine (NORVASC) 10 MG tablet. He states his previous doctor prescribed these and he is now under [employer] care.  [id number] [place], [place] - [address] [place]  [address] Phone: [phone] Fax: [phone]

## 2018-04-12 ENCOUNTER — Ambulatory Visit: Payer: Medicare Other | Admitting: Podiatry

## 2018-04-12 DIAGNOSIS — G629 Polyneuropathy, unspecified: Secondary | ICD-10-CM | POA: Diagnosis not present

## 2018-04-12 DIAGNOSIS — M5416 Radiculopathy, lumbar region: Secondary | ICD-10-CM

## 2018-04-12 DIAGNOSIS — M722 Plantar fascial fibromatosis: Secondary | ICD-10-CM | POA: Diagnosis not present

## 2018-04-12 MED ORDER — GABAPENTIN 100 MG PO CAPS: 100.0000 mg | ORAL_CAPSULE | Freq: Three times a day (TID) | ORAL | 3 refills | Status: DC

## 2018-04-13 NOTE — Progress Notes (Signed)
   HPI: 63 year old male presenting today as a new patient with a chief complaint of intermittent burning pain of the bilateral feet that began 3-4 months ago. He reports associated nodules on the arches and balls of the feet. He reports some mild heel pain. Walking and standing increases the pain. He has not done anything for treatment. Patient is here for further evaluation and treatment.   Past Medical History:  Diagnosis Date  . Arthritis   . Depression   . GERD (gastroesophageal reflux disease)   . Hypertension   . Polyp of colon       Physical Exam: General: The patient is alert and oriented x3 in no acute distress.  Dermatology: Skin is warm, dry and supple bilateral lower extremities. Negative for open lesions or macerations.  Vascular: Palpable pedal pulses bilaterally. No edema or erythema noted. Capillary refill within normal limits.  Neurological: Epicritic and protective threshold grossly intact bilaterally.   Musculoskeletal Exam: Palpable nodule noted to the plantar medial longitudinal arch of the bilateral feet. Pain with palpation also noted to the area. Range of motion within normal limits to all pedal and ankle joints bilateral. Muscle strength 5/5 in all groups bilateral.   Assessment: 1. plantar fibroma bilateral feet    Plan of Care:  1. Patient evaluated. X-Rays in Epic reviewed.  2. Recommended good shoe gear.  3. Prescription for Gabapentin 100 mg TID #90 with one refill provided to patient.  4. Return to clinic as needed.     Edrick Kins, DPM [facility]  Dr. Edrick Kins, DPM    [address], [place] [address]                Office [phone]  Fax [phone]

## 2018-06-21 ENCOUNTER — Other Ambulatory Visit: Payer: Self-pay | Admitting: Family Medicine

## 2018-06-21 DIAGNOSIS — M5441 Lumbago with sciatica, right side: Principal | ICD-10-CM

## 2018-06-21 DIAGNOSIS — M5442 Lumbago with sciatica, left side: Principal | ICD-10-CM

## 2018-06-21 DIAGNOSIS — G4700 Insomnia, unspecified: Secondary | ICD-10-CM

## 2018-06-21 DIAGNOSIS — G8929 Other chronic pain: Secondary | ICD-10-CM

## 2018-06-21 NOTE — Telephone Encounter (Signed)
I will send in Rx for patient  Please schedule him a follow up some time in June/july, he has no upcoming appt scheduled  Thanks!  LG

## 2018-06-21 NOTE — Telephone Encounter (Signed)
Last OV 04/12/2018  Last refilled  clonazePAM (KLONOPIN) 0.5 MG tablet 30 tablet 2 03/24/2018    Disp Refills Start End   cyclobenzaprine (FLEXERIL) 10 MG tablet 30 tablet 2 03/29/2018         Next OV none scheduled   Sent to [employer]

## 2018-06-22 NOTE — Telephone Encounter (Signed)
Called and left a message stating the [facility] flex[facility]il [facility] [place] and I scheduled him a appt to f/up with L. guse in June. Gae [email or web address]

## 2018-08-08 ENCOUNTER — Other Ambulatory Visit: Payer: Self-pay

## 2018-08-11 ENCOUNTER — Other Ambulatory Visit: Payer: Self-pay

## 2018-08-11 ENCOUNTER — Encounter: Payer: Self-pay | Admitting: Family Medicine

## 2018-08-11 ENCOUNTER — Ambulatory Visit (INDEPENDENT_AMBULATORY_CARE_PROVIDER_SITE_OTHER): Payer: Medicare Other | Admitting: Family Medicine

## 2018-08-11 VITALS — BP 132/86 | HR 64 | Temp 98.2°F | Resp 16 | Ht 67.0 in | Wt 182.0 lb

## 2018-08-11 DIAGNOSIS — M79672 Pain in left foot: Secondary | ICD-10-CM

## 2018-08-11 DIAGNOSIS — M5442 Lumbago with sciatica, left side: Secondary | ICD-10-CM

## 2018-08-11 DIAGNOSIS — M79671 Pain in right foot: Secondary | ICD-10-CM | POA: Diagnosis not present

## 2018-08-11 DIAGNOSIS — I10 Essential (primary) hypertension: Secondary | ICD-10-CM

## 2018-08-11 DIAGNOSIS — G8929 Other chronic pain: Secondary | ICD-10-CM

## 2018-08-11 DIAGNOSIS — M5441 Lumbago with sciatica, right side: Secondary | ICD-10-CM | POA: Diagnosis not present

## 2018-08-11 DIAGNOSIS — G4700 Insomnia, unspecified: Secondary | ICD-10-CM | POA: Diagnosis not present

## 2018-08-11 MED ORDER — GABAPENTIN 100 MG PO CAPS: 100.0000 mg | ORAL_CAPSULE | Freq: Three times a day (TID) | ORAL | 3 refills | Status: DC

## 2018-08-11 MED ORDER — CLONAZEPAM 0.5 MG PO TABS: 0.5000 mg | ORAL_TABLET | Freq: Every day | ORAL | 2 refills | Status: DC

## 2018-08-11 NOTE — Patient Instructions (Signed)
Shoe brands to look into:  [facility]

## 2018-08-11 NOTE — Progress Notes (Signed)
Subjective:    Patient ID: Jeffrey Yang, male    DOB: [birth year], 63 y.o.   MRN: [id number]  HPI   Patient presents to clinic for follow-up on blood pressure, chronic back pain and bilateral foot pain.  Patient has used clonazepam 0.5 mg before bedtime for years with good effect.  He has this medication helps him get to sleep and stay asleep.  He has never felt he needed a dose increase.  He has tried different medicines throughout the years, but always ends up going back to clonazepam as it is most effective.  Blood pressure stable on current doses of Bystolic and amlodipine.  Patient struggles with chronic low back pain.  Gabapentin 3 times daily helps to keep back pain under decent control.  He did see podiatry in regards to bilateral foot pain, foot exam and been unremarkable and podiatrist believes a lot of his foot pain is directly related to low back pain.   Patient Active Problem List   Diagnosis Date Noted  . Essential hypertension 12/22/2017  . Foot pain, bilateral 12/22/2017  . Insomnia 12/22/2017  . Chronic bilateral low back pain with bilateral sciatica 12/22/2017  . Chronic neck pain 12/22/2017   Social History   Tobacco Use  . Smoking status: Never Smoker  . Smokeless tobacco: Never Used  Substance Use Topics  . Alcohol use: Yes   Review of Systems  Constitutional: Negative for chills, fatigue and fever.  HENT: Negative for congestion, ear pain, sinus pain and sore throat.   Eyes: Negative.   Respiratory: Negative for cough, shortness of breath and wheezing.   Cardiovascular: Negative for chest pain, palpitations and leg swelling.  Gastrointestinal: Negative for abdominal pain, diarrhea, nausea and vomiting.  Genitourinary: Negative for dysuria, frequency and urgency.  Musculoskeletal: +chronic low back pain, bilat feet pain Skin: Negative for color change, pallor and rash.  Neurological: Negative for syncope, light-headedness and headaches.   Psychiatric/Behavioral: The patient is not nervous/anxious.       Objective:   Physical Exam Vitals signs and nursing note reviewed.  Constitutional:      General: He is not in acute distress.    Appearance: He is not ill-appearing, toxic-appearing or diaphoretic.  HENT:     Head: Normocephalic and atraumatic.  Eyes:     General: No scleral icterus.    Extraocular Movements: Extraocular movements intact.     Pupils: Pupils are equal, round, and reactive to light.  Neck:     Musculoskeletal: Neck supple. No neck rigidity.  Cardiovascular:     Rate and Rhythm: Normal rate and regular rhythm.     Heart sounds: Normal heart sounds.  Musculoskeletal:     Comments: Chronic low back pain, pain into feet.   Skin:    General: Skin is warm and dry.     Coloration: Skin is not pale.  Neurological:     General: No focal deficit present.     Mental Status: He is alert and oriented to person, place, and time.     Gait: Gait normal.  Psychiatric:        Mood and Affect: Mood normal.        Behavior: Behavior normal.     Vitals:   08/11/18 1010  BP: 132/86  Pulse: 64  Resp: 16  Temp: 98.2 F (36.8 C)  SpO2: 97%      Assessment & Plan:    A total of 25 minutes were spent face-to-face with the patient during  this encounter and over half of that time was spent on counseling and coordination of care. The patient was counseled on different strategies to help her low back pain and foot pain, different reputable brands of shoes today recommend he look into trying as a way to help provide good support for her feet and back.     Insomnia-patient's insomnia is well controlled with the clonazepam before bedtime.  [facility] PMP registry checked and refill placed for this medication.  Chronic low back pain/bilateral feet pain- patient's back pain is overall controlled with gabapentin and he will use Flexeril as needed.  Also discussed with patient trialing different brands of shoe for  good foot and back support.  Suggested patient go to a reputable shoe store and try different brands to find what works for him.  Essential hypertension-blood pressure well controlled on Bystolic and amlodipine.  He will continue.  Patient will follow-up in 3 months for recheck on chronic conditions.  Advised to return to clinic sooner if any issues arise.

## 2018-09-16 ENCOUNTER — Other Ambulatory Visit: Payer: Self-pay | Admitting: Family Medicine

## 2018-09-16 DIAGNOSIS — M5442 Lumbago with sciatica, left side: Secondary | ICD-10-CM

## 2018-09-16 DIAGNOSIS — G8929 Other chronic pain: Secondary | ICD-10-CM

## 2018-11-14 ENCOUNTER — Other Ambulatory Visit: Payer: Self-pay

## 2018-11-16 ENCOUNTER — Ambulatory Visit (INDEPENDENT_AMBULATORY_CARE_PROVIDER_SITE_OTHER): Payer: Medicare Other | Admitting: Family Medicine

## 2018-11-16 ENCOUNTER — Encounter: Payer: Self-pay | Admitting: Family Medicine

## 2018-11-16 ENCOUNTER — Other Ambulatory Visit: Payer: Self-pay

## 2018-11-16 VITALS — BP 124/70 | HR 77 | Temp 97.5°F | Ht 67.72 in | Wt 185.2 lb

## 2018-11-16 DIAGNOSIS — M542 Cervicalgia: Secondary | ICD-10-CM

## 2018-11-16 DIAGNOSIS — M5441 Lumbago with sciatica, right side: Secondary | ICD-10-CM

## 2018-11-16 DIAGNOSIS — G4700 Insomnia, unspecified: Secondary | ICD-10-CM | POA: Diagnosis not present

## 2018-11-16 DIAGNOSIS — G8929 Other chronic pain: Secondary | ICD-10-CM

## 2018-11-16 DIAGNOSIS — I10 Essential (primary) hypertension: Secondary | ICD-10-CM

## 2018-11-16 DIAGNOSIS — M79671 Pain in right foot: Secondary | ICD-10-CM

## 2018-11-16 DIAGNOSIS — M5442 Lumbago with sciatica, left side: Secondary | ICD-10-CM

## 2018-11-16 DIAGNOSIS — M79672 Pain in left foot: Secondary | ICD-10-CM

## 2018-11-16 NOTE — Progress Notes (Signed)
Subjective:    Patient ID: Jeffrey Yang, male    DOB: 1955/11/26, 63 y.o.   MRN: [id number]  HPI   Patient presents to clinic for follow-up on blood pressure, chronic pain and insomnia.  Blood pressure is stable on current medications.  No chest pain, shortness of breath or lower extremity swelling.  Has taken clonazepam for insomnia for many years, this works well for him.  He does take drug holiday at times, but often finds himself needing the clonazepam doing to being on it for many many years.  Discussed possibility of weaning off, patient would prefer to stay on.  He is on very low-dose.  Chronic pain managed by specialist.  Pain only controlled with Tylenol and gabapentin.  Tries to do stretching and range of motion to help neck, low back and feet.  Tries to wear supportive sneakers.  Patient Active Problem List   Diagnosis Date Noted  . Essential hypertension 12/22/2017  . Foot pain, bilateral 12/22/2017  . Insomnia 12/22/2017  . Chronic bilateral low back pain with bilateral sciatica 12/22/2017  . Chronic neck pain 12/22/2017   Social History   Tobacco Use  . Smoking status: Never Smoker  . Smokeless tobacco: Never Used  Substance Use Topics  . Alcohol use: Yes   Review of Systems  Constitutional: Negative for chills, fatigue and fever.  HENT: Negative for congestion, ear pain, sinus pain and sore throat.   Eyes: Negative.   Respiratory: Negative for cough, shortness of breath and wheezing.   Cardiovascular: Negative for chest pain, palpitations and leg swelling.  Gastrointestinal: Negative for abdominal pain, diarrhea, nausea and vomiting.  Genitourinary: Negative for dysuria, frequency and urgency.  Musculoskeletal: Negative for arthralgias and myalgias.  Skin: Negative for color change, pallor and rash.  Neurological: Negative for syncope, light-headedness and headaches.  Psychiatric/Behavioral: The patient is not nervous/anxious.       Objective:   Physical  Exam Vitals signs and nursing note reviewed.  Constitutional:      General: He is not in acute distress.    Appearance: He is not ill-appearing, toxic-appearing or diaphoretic.  HENT:     Head: Normocephalic and atraumatic.  Eyes:     General: No scleral icterus.    Extraocular Movements: Extraocular movements intact.     Conjunctiva/sclera: Conjunctivae normal.     Pupils: Pupils are equal, round, and reactive to light.  Neck:     Musculoskeletal: Neck supple.     Vascular: No carotid bruit.  Cardiovascular:     Rate and Rhythm: Normal rate and regular rhythm.     Heart sounds: Normal heart sounds.  Pulmonary:     Effort: Pulmonary effort is normal. No respiratory distress.     Breath sounds: Normal breath sounds.  Musculoskeletal:     Right lower leg: No edema.     Left lower leg: No edema.  Skin:    General: Skin is warm and dry.     Coloration: Skin is not jaundiced or pale.  Neurological:     General: No focal deficit present.     Mental Status: He is alert and oriented to person, place, and time.     Gait: Gait normal.  Psychiatric:        Mood and Affect: Mood normal.        Behavior: Behavior normal.        Thought Content: Thought content normal.        Judgment: Judgment normal.  Today's Vitals   11/16/18 0828  BP: 124/70  Pulse: 77  Temp: (!) 97.5 F (36.4 C)  TempSrc: Temporal  SpO2: 98%  Weight: 185 lb 3.2 oz (84 kg)  Height: 5' 7.72" (1.72 m)   Body mass index is 28.4 kg/m.     Assessment & Plan:    Hypertension-stable on amlodipine and Bystolic.  We will continue.  Insomnia-stable on clonazepam.  He will continue.  Chronic pain in neck, back and feet-patient will continue to follow with specialist.  He will take gabapentin as prescribed.  He will continue to wear good supportive shoes and do stretching range of motion exercises daily  Patient will follow-up in 6 months for recheck and chronic conditions.  He is aware he can return to  clinic sooner if any issues arise.

## 2018-12-18 ENCOUNTER — Other Ambulatory Visit: Payer: Self-pay | Admitting: Family Medicine

## 2018-12-18 DIAGNOSIS — G8929 Other chronic pain: Secondary | ICD-10-CM

## 2018-12-18 DIAGNOSIS — G4700 Insomnia, unspecified: Secondary | ICD-10-CM

## 2019-03-24 ENCOUNTER — Other Ambulatory Visit: Payer: Self-pay

## 2019-03-24 ENCOUNTER — Telehealth: Payer: Self-pay | Admitting: Family Medicine

## 2019-03-24 DIAGNOSIS — G4700 Insomnia, unspecified: Secondary | ICD-10-CM

## 2019-03-24 DIAGNOSIS — G8929 Other chronic pain: Secondary | ICD-10-CM

## 2019-03-24 DIAGNOSIS — M5442 Lumbago with sciatica, left side: Secondary | ICD-10-CM

## 2019-03-24 MED ORDER — CLONAZEPAM 0.5 MG PO TABS: 0.5000 mg | ORAL_TABLET | Freq: Every day | ORAL | 1 refills | Status: DC

## 2019-03-24 MED ORDER — CYCLOBENZAPRINE HCL 10 MG PO TABS: ORAL_TABLET | ORAL | 0 refills | Status: DC

## 2019-03-24 NOTE — Telephone Encounter (Signed)
Klonopin last refilled 12/20/18 Last OV 11/16/18 Next OV 04/28/19  I have refilled flexeril for 30 days.

## 2019-03-24 NOTE — Telephone Encounter (Signed)
Patient called and notified & confirmed that he will keep appointment on 3/12.

## 2019-03-24 NOTE — Telephone Encounter (Signed)
Pt needs a refill on clonazePAM (KLONOPIN) 0.5 MG tablet and cyclobenzaprine (FLEXERIL) [facility] is [facility] of medication  Pt has set up a TOC appt for 3/12 with M. Arnett

## 2019-03-24 NOTE — Telephone Encounter (Signed)
Call pt Klonopin refilled Please advise that  calls at least 7 days in advance prior to medication running out.   He has vv appt with [place] in march  I looked up patient on [place] Controlled Substances Reporting System and saw no activity that raised concern of inappropriate use.

## 2019-04-28 ENCOUNTER — Ambulatory Visit (INDEPENDENT_AMBULATORY_CARE_PROVIDER_SITE_OTHER): Payer: Medicare Other | Admitting: Family

## 2019-04-28 ENCOUNTER — Encounter: Payer: Self-pay | Admitting: Family

## 2019-04-28 ENCOUNTER — Telehealth: Payer: Self-pay | Admitting: Family

## 2019-04-28 ENCOUNTER — Other Ambulatory Visit: Payer: Self-pay

## 2019-04-28 VITALS — BP 120/76 | HR 66 | Temp 97.9°F | Ht 68.0 in | Wt 186.4 lb

## 2019-04-28 DIAGNOSIS — M5442 Lumbago with sciatica, left side: Secondary | ICD-10-CM

## 2019-04-28 DIAGNOSIS — Z125 Encounter for screening for malignant neoplasm of prostate: Secondary | ICD-10-CM | POA: Diagnosis not present

## 2019-04-28 DIAGNOSIS — M79671 Pain in right foot: Secondary | ICD-10-CM | POA: Diagnosis not present

## 2019-04-28 DIAGNOSIS — G4700 Insomnia, unspecified: Secondary | ICD-10-CM | POA: Diagnosis not present

## 2019-04-28 DIAGNOSIS — M542 Cervicalgia: Secondary | ICD-10-CM | POA: Diagnosis not present

## 2019-04-28 DIAGNOSIS — G8929 Other chronic pain: Secondary | ICD-10-CM

## 2019-04-28 DIAGNOSIS — Z5181 Encounter for therapeutic drug level monitoring: Secondary | ICD-10-CM | POA: Diagnosis not present

## 2019-04-28 DIAGNOSIS — I10 Essential (primary) hypertension: Secondary | ICD-10-CM

## 2019-04-28 DIAGNOSIS — M79672 Pain in left foot: Secondary | ICD-10-CM | POA: Diagnosis not present

## 2019-04-28 DIAGNOSIS — M5441 Lumbago with sciatica, right side: Secondary | ICD-10-CM | POA: Diagnosis not present

## 2019-04-28 LAB — COMPREHENSIVE METABOLIC PANEL
ALT: 15 U/L (ref 0–53)
AST: 17 U/L (ref 0–37)
Albumin: 4.5 g/dL (ref 3.5–5.2)
Alkaline Phosphatase: 88 U/L (ref 39–117)
BUN: 13 mg/dL (ref 6–23)
CO2: 28 mEq/L (ref 19–32)
Calcium: 9.9 mg/dL (ref 8.4–10.5)
Chloride: 101 mEq/L (ref 96–112)
Creatinine, Ser: 1.09 mg/dL (ref 0.40–1.50)
GFR: 68.2 mL/min (ref >60.00)
Glucose, Bld: 122 mg/dL — ABNORMAL HIGH (ref 70–99)
Potassium: 4.5 mEq/L (ref 3.5–5.1)
Sodium: 136 mEq/L (ref 135–145)
Total Bilirubin: 0.8 mg/dL (ref 0.2–1.2)
Total Protein: 7.2 g/dL (ref 6.0–8.3)

## 2019-04-28 LAB — CBC WITH DIFFERENTIAL/PLATELET
Basophils Absolute: 0 10*3/uL (ref 0.0–0.1)
Basophils Relative: 0.7 % (ref 0.0–3.0)
Eosinophils Absolute: 0.2 10*3/uL (ref 0.0–0.7)
Eosinophils Relative: 3.6 % (ref 0.0–5.0)
HCT: 49.3 % (ref 39.0–52.0)
Hemoglobin: 17.2 g/dL — ABNORMAL HIGH (ref 13.0–17.0)
Lymphocytes Relative: 39.6 % (ref 12.0–46.0)
Lymphs Abs: 2.6 10*3/uL (ref 0.7–4.0)
MCHC: 34.9 g/dL (ref 30.0–36.0)
MCV: 95.2 fl (ref 78.0–100.0)
Monocytes Absolute: 0.5 10*3/uL (ref 0.1–1.0)
Monocytes Relative: 7 % (ref 3.0–12.0)
Neutro Abs: 3.2 10*3/uL (ref 1.4–7.7)
Neutrophils Relative %: 49.1 % (ref 43.0–77.0)
Platelets: 258 10*3/uL (ref 150.0–400.0)
RBC: 5.18 Mil/uL (ref 4.22–5.81)
RDW: 13.6 % (ref 11.5–15.5)
WBC: 6.6 10*3/uL (ref 4.0–10.5)

## 2019-04-28 LAB — LIPID PANEL
Cholesterol: 215 mg/dL — ABNORMAL HIGH (ref 0–200)
HDL: 43.9 mg/dL (ref >39.00)
LDL Cholesterol: 133 mg/dL — ABNORMAL HIGH (ref 0–99)
NonHDL: 170.91
Total CHOL/HDL Ratio: 5
Triglycerides: 188 mg/dL — ABNORMAL HIGH (ref 0.0–149.0)
VLDL: 37.6 mg/dL (ref 0.0–40.0)

## 2019-04-28 LAB — IBC + FERRITIN
Ferritin: 135.6 ng/mL (ref 22.0–322.0)
Iron: 155 ug/dL (ref 42–165)
Saturation Ratios: 35.7 % (ref 20.0–50.0)
Transferrin: 310 mg/dL (ref 212.0–360.0)

## 2019-04-28 LAB — PSA: PSA: 1.53 ng/mL (ref 0.10–4.00)

## 2019-04-28 LAB — VITAMIN D 25 HYDROXY (VIT D DEFICIENCY, FRACTURES): VITD: 43.4 ng/mL (ref 30.00–100.00)

## 2019-04-28 MED ORDER — AMLODIPINE BESYLATE 10 MG PO TABS: 10.0000 mg | ORAL_TABLET | Freq: Every day | ORAL | 3 refills | Status: DC

## 2019-04-28 MED ORDER — NEBIVOLOL HCL 5 MG PO TABS: 5.0000 mg | ORAL_TABLET | Freq: Every day | ORAL | 1 refills | Status: DC

## 2019-04-28 MED ORDER — CYCLOBENZAPRINE HCL 10 MG PO TABS: ORAL_TABLET | ORAL | 0 refills | Status: DC

## 2019-04-28 NOTE — Patient Instructions (Signed)
Stay safe Nice to meet you!

## 2019-04-28 NOTE — Progress Notes (Signed)
Subjective:    Patient ID: Jeffrey Yang, male    DOB: [birth year], 64 y.o.   MRN: [id number]  CC: Jeffrey Yang is a 64 y.o. male who presents today for follow up.   HPI: Transfer of care Feels well today. No new complaints.   HTN- compliant with medication.  No cp, sob.  Enjoys riding bike, working in yard, especially in [place]. No sob, cp.   Anxiety- takes klonopin to help fall asleep.  No falls.   Neck pain and low back pain- chronic.  Numbness in BL feet and also numbness right lateral thigh. These symptoms have been chronic and are at baseline. Taking gabapentin 100mg  qhs, occasionally takes 100mg  qam with relief.  Takes flexeril qhs. No pain in calves, wounds, cold extremities. Had been on narcotics in the past and doesn't not to go back to that.   RLS- doing well on gabapentin, flexeril.   No trouble urinating, reduced stream    HISTORY:  Past Medical History:  Diagnosis Date  . Arthritis   . Depression   . GERD (gastroesophageal reflux disease)   . Hypertension   . Polyp of colon    Past Surgical History:  Procedure Laterality Date  . LUMBAR SPINE SURGERY  1990   Dr Quentin Cornwall neurosurgery  . LUMBAR SPINE SURGERY  2001   Dr Astrid Drafts  . NECK SURGERY  1994, 1997, 2001   plate and 2 screws in neck; 2001 cerical fusion Dr Doristine Counter  . OTHER SURGICAL HISTORY    . TONSILLECTOMY  1965   Family History  Problem Relation Age of Onset  . Hypertension Father     Allergies: Patient has no known allergies. Current Outpatient Medications on File Prior to Visit  Medication Sig Dispense Refill  . clonazePAM (KLONOPIN) 0.5 MG tablet Take 1 tablet (0.5 mg total) by mouth at bedtime. 30 tablet 1  . gabapentin (NEURONTIN) 100 MG capsule Take 1 capsule (100 mg total) by mouth 3 (three) times daily. 90 capsule 3   No current facility-administered medications on file prior to visit.    Social History   Tobacco Use  . Smoking status: Never Smoker  . Smokeless  tobacco: Never Used  Substance Use Topics  . Alcohol use: Yes  . Drug use: Never    Review of Systems  Constitutional: Negative for chills and fever.  Respiratory: Negative for cough.   Cardiovascular: Negative for chest pain and palpitations.  Gastrointestinal: Negative for nausea and vomiting.  Musculoskeletal: Positive for back pain.  Neurological: Positive for numbness. Negative for headaches.  Psychiatric/Behavioral: Positive for sleep disturbance. Negative for suicidal ideas.      Objective:    BP 120/76   Pulse 66   Temp 97.9 F (36.6 C)   Ht 5\' 8"  (1.727 m)   Wt 186 lb 6.4 oz (84.6 kg)   SpO2 97%   BMI 28.34 kg/m  BP Readings from Last 3 Encounters:  04/28/19 120/76  11/16/18 124/70  08/11/18 132/86   Wt Readings from Last 3 Encounters:  04/28/19 186 lb 6.4 oz (84.6 kg)  11/16/18 185 lb 3.2 oz (84 kg)  08/11/18 182 lb (82.6 kg)    Physical Exam Vitals reviewed.  Constitutional:      Appearance: He is well-developed.  Cardiovascular:     Rate and Rhythm: Regular rhythm.     Heart sounds: Normal heart sounds.  Pulmonary:     Effort: Pulmonary effort is normal. No respiratory distress.  Breath sounds: Normal breath sounds. No wheezing, rhonchi or rales.  Skin:    General: Skin is warm and dry.  Neurological:     Mental Status: He is alert.  Psychiatric:        Speech: Speech normal.        Behavior: Behavior normal.        Assessment & Plan:   Problem List Items Addressed This Visit      Cardiovascular and Mediastinum   Essential hypertension    Stable, controlled.  Continue Bystolic, Norvasc      Relevant Medications   amLODipine (NORVASC) 10 MG tablet   nebivolol (BYSTOLIC) 5 MG tablet   Other Relevant Orders   Comprehensive metabolic panel   Lipid panel   VITAMIN D 25 Hydroxy (Vit-D Deficiency, Fractures)     Nervous and Auditory   Chronic bilateral low back pain with bilateral sciatica    Chronic, stable.  Patient is on  disability for this.  Continue current regimen      Relevant Medications   cyclobenzaprine (FLEXERIL) 10 MG tablet   Other Relevant Orders   CBC with Differential/Platelet   Comprehensive metabolic panel     Other   Chronic neck pain - Primary   Relevant Medications   cyclobenzaprine (FLEXERIL) 10 MG tablet   Other Relevant Orders   IBC + Ferritin   Foot pain, bilateral   Relevant Orders   Pain Mgmt, Profile 8 w/Conf, U   Insomnia    Stable, controlled on Klonopin.  Discussed risk of benzodiazepines and data supporting cognitive decline, dementia.  Discussed risk of medication including falls, addiction.  Patient is aware of this.  He understands to store in a safe place.  Controlled substance contract, urine drug screen today ( plan to do annual).  Follow-up in 3 months.       Relevant Orders   Pain Mgmt, Profile 8 w/Conf, U    Other Visit Diagnoses    Screening for prostate cancer       Relevant Orders   PSA       I have changed Jeffrey Yang's nebivolol. I am also having him maintain his gabapentin, clonazePAM, cyclobenzaprine, and amLODipine.   Meds ordered this encounter  Medications  . cyclobenzaprine (FLEXERIL) 10 MG tablet    Sig: TAKE HALF(1/2) TABLETS BY MOUTH 3 (THREE) TIMES DAILY AS NEEDED FOR MUSCLE SPASMS.    Dispense:  30 tablet    Refill:  0    Order Specific Question:   Supervising Provider    Answer:   Deborra Medina L [[id number]]  . amLODipine (NORVASC) 10 MG tablet    Sig: Take 1 tablet (10 mg total) by mouth daily.    Dispense:  90 tablet    Refill:  3    Order Specific Question:   Supervising Provider    Answer:   Deborra Medina L [[id number]]  . nebivolol (BYSTOLIC) 5 MG tablet    Sig: Take 1 tablet (5 mg total) by mouth daily.    Dispense:  90 tablet    Refill:  1    Order Specific Question:   Supervising Provider    Answer:   Crecencio Mc [[id number]]    Return precautions given.   Risks, benefits, and alternatives of the medications and treatment  plan prescribed today were discussed, and patient expressed understanding.   Education regarding symptom management and diagnosis given to patient on AVS.  Continue to follow with Burnard Hawthorne, FNP for routine  health maintenance.   [name] and I agreed with plan.   Mable Paris, FNP

## 2019-04-28 NOTE — Telephone Encounter (Signed)
Left voicemail to schedule 3 month follow up

## 2019-04-28 NOTE — Assessment & Plan Note (Addendum)
Chronic, stable.  Patient is on disability for this.  Continue current regimen

## 2019-04-28 NOTE — Assessment & Plan Note (Signed)
Stable, controlled.  Continue Bystolic, Norvasc

## 2019-04-28 NOTE — Assessment & Plan Note (Signed)
Stable, controlled on Klonopin.  Discussed risk of benzodiazepines and data supporting cognitive decline, dementia.  Discussed risk of medication including falls, addiction.  Patient is aware of this.  He understands to store in a safe place.  Controlled substance contract, urine drug screen today ( plan to do annual).  Follow-up in 3 months.

## 2019-05-01 LAB — PAIN MGMT, PROFILE 8 W/CONF, U
6 Acetylmorphine: NEGATIVE ng/mL
Alcohol Metabolites: POSITIVE ng/mL — AB (ref <500)
Amphetamines: NEGATIVE ng/mL
Benzodiazepines: NEGATIVE ng/mL
Buprenorphine, Urine: NEGATIVE ng/mL
Cocaine Metabolite: NEGATIVE ng/mL
Creatinine: 108.5 mg/dL
Ethyl Glucuronide (ETG): 1554 ng/mL
Ethyl Sulfate (ETS): 474 ng/mL
MDMA: NEGATIVE ng/mL
Marijuana Metabolite: 3193 ng/mL
Marijuana Metabolite: POSITIVE ng/mL
Opiates: NEGATIVE ng/mL
Oxidant: NEGATIVE ug/mL
Oxycodone: NEGATIVE ng/mL
pH: 7.2 (ref 4.5–9.0)

## 2019-05-09 ENCOUNTER — Other Ambulatory Visit: Payer: Self-pay | Admitting: Family

## 2019-05-09 ENCOUNTER — Telehealth: Payer: Self-pay | Admitting: Family

## 2019-05-09 DIAGNOSIS — E785 Hyperlipidemia, unspecified: Secondary | ICD-10-CM

## 2019-05-09 DIAGNOSIS — G4700 Insomnia, unspecified: Secondary | ICD-10-CM

## 2019-05-09 MED ORDER — ROSUVASTATIN CALCIUM 10 MG PO TABS: 10.0000 mg | ORAL_TABLET | Freq: Every day | ORAL | 3 refills | Status: DC

## 2019-05-09 MED ORDER — TRAZODONE HCL 50 MG PO TABS: 25.0000 mg | ORAL_TABLET | Freq: Every evening | ORAL | 3 refills | Status: DC | PRN

## 2019-05-09 NOTE — Telephone Encounter (Signed)
Spoke with patient at length about lab results Urine + marijuana and alcohol.  He states that he 3-4 drinks per night. Drinking whiskey. Stressful since the pandemic and used to only drink on the weekend.  Doesn't feel the needs to cut down on drinking alcohol.  People do not feel annoyed by alcohol use. Denies feeling guilty for alcohol consumption or having an eye opener. Using klonopin 0.5mg  qhs We agreed that not safe to take benzodiazepine with alcohol. We will wean off to OFF the klonopin, advised the below and he read back the instructions to me over the phone.   Start half tablet klonopin tonight, do this for one week The following week, take half tablet every other day. The third week, take half tablet every 3rd day. Then stop.   Agreeable to start crestor based on elevated CVD risk ( 13.2%) and repeat labs in 6 weeks.   Agreeable to  Trial trazodone for insomnia. I have sent in.

## 2019-05-12 ENCOUNTER — Ambulatory Visit: Payer: Medicare Other | Attending: Internal Medicine

## 2019-05-12 DIAGNOSIS — Z23 Encounter for immunization: Secondary | ICD-10-CM

## 2019-05-12 NOTE — Progress Notes (Signed)
   Covid-19 Vaccination Clinic  Name:  JATHAN KOTARSKI    MRN: [id number] DOB: September 29, 1955  05/12/2019  Mr. Montville was observed post Covid-19 immunization for 15 minutes without incident. He was provided with Vaccine Information Sheet and instruction to access the V-Safe system.   Mr. Oshinski was instructed to call 911 with any severe reactions post vaccine: Marland Kitchen Difficulty breathing  . Swelling of face and throat  . A fast heartbeat  . A bad rash all over body  . Dizziness and weakness   Immunizations Administered    Name Date Dose VIS Date Route   Pfizer COVID-19 Vaccine 05/12/2019  8:45 AM 0.3 mL 01/27/2019 Intramuscular   Manufacturer: [employer], [employer]   Lot: [id number]   [place]: [id number]

## 2019-05-17 ENCOUNTER — Ambulatory Visit: Payer: Medicare Other | Admitting: Family Medicine

## 2019-05-30 ENCOUNTER — Other Ambulatory Visit: Payer: Self-pay | Admitting: Family

## 2019-05-30 DIAGNOSIS — G8929 Other chronic pain: Secondary | ICD-10-CM

## 2019-06-06 ENCOUNTER — Ambulatory Visit: Payer: Medicare Other | Attending: Internal Medicine

## 2019-06-06 DIAGNOSIS — Z23 Encounter for immunization: Secondary | ICD-10-CM

## 2019-06-06 NOTE — Progress Notes (Signed)
   Covid-19 Vaccination Clinic  Name:  Jeffrey Yang    MRN: [id number] DOB: 1955/07/11  06/06/2019  [name] was observed post Covid-19 immunization for 15 minutes without incident. He was provided with Vaccine Information Sheet and instruction to access the V-Safe system.   [name] was instructed to call 911 with any severe reactions post vaccine: Marland Kitchen Difficulty breathing  . Swelling of face and throat  . A fast heartbeat  . A bad rash all over body  . Dizziness and weakness   Immunizations Administered    Name Date Dose VIS Date Route   Pfizer COVID-19 Vaccine 06/06/2019  8:28 AM 0.3 mL 04/12/2018 Intramuscular   Manufacturer: [employer], [employer]   Lot: [id number]   [place]: [id number]

## 2019-06-19 ENCOUNTER — Other Ambulatory Visit (INDEPENDENT_AMBULATORY_CARE_PROVIDER_SITE_OTHER): Payer: Medicare Other

## 2019-06-19 ENCOUNTER — Other Ambulatory Visit: Payer: Self-pay

## 2019-06-19 DIAGNOSIS — E785 Hyperlipidemia, unspecified: Secondary | ICD-10-CM

## 2019-06-19 LAB — COMPREHENSIVE METABOLIC PANEL
ALT: 17 U/L (ref 0–53)
AST: 16 U/L (ref 0–37)
Albumin: 4.4 g/dL (ref 3.5–5.2)
Alkaline Phosphatase: 92 U/L (ref 39–117)
BUN: 14 mg/dL (ref 6–23)
CO2: 25 mEq/L (ref 19–32)
Calcium: 9 mg/dL (ref 8.4–10.5)
Chloride: 105 mEq/L (ref 96–112)
Creatinine, Ser: 1.17 mg/dL (ref 0.40–1.50)
GFR: 62.82 mL/min (ref >60.00)
Glucose, Bld: 138 mg/dL — ABNORMAL HIGH (ref 70–99)
Potassium: 4.1 mEq/L (ref 3.5–5.1)
Sodium: 138 mEq/L (ref 135–145)
Total Bilirubin: 0.5 mg/dL (ref 0.2–1.2)
Total Protein: 6.7 g/dL (ref 6.0–8.3)

## 2019-06-26 ENCOUNTER — Other Ambulatory Visit: Payer: Self-pay | Admitting: Family

## 2019-06-26 DIAGNOSIS — M5441 Lumbago with sciatica, right side: Secondary | ICD-10-CM

## 2019-06-26 DIAGNOSIS — M5442 Lumbago with sciatica, left side: Secondary | ICD-10-CM

## 2019-07-24 ENCOUNTER — Encounter: Payer: Self-pay | Admitting: Family

## 2019-07-24 ENCOUNTER — Ambulatory Visit (INDEPENDENT_AMBULATORY_CARE_PROVIDER_SITE_OTHER): Payer: Medicare Other | Admitting: Family

## 2019-07-24 ENCOUNTER — Other Ambulatory Visit: Payer: Self-pay

## 2019-07-24 VITALS — BP 140/66 | HR 60 | Temp 97.6°F | Ht 67.99 in | Wt 181.0 lb

## 2019-07-24 DIAGNOSIS — I10 Essential (primary) hypertension: Secondary | ICD-10-CM

## 2019-07-24 DIAGNOSIS — G4700 Insomnia, unspecified: Secondary | ICD-10-CM | POA: Diagnosis not present

## 2019-07-24 DIAGNOSIS — R21 Rash and other nonspecific skin eruption: Secondary | ICD-10-CM | POA: Diagnosis not present

## 2019-07-24 MED ORDER — TRAZODONE HCL 50 MG PO TABS: 25.0000 mg | ORAL_TABLET | Freq: Every evening | ORAL | 3 refills | Status: DC | PRN

## 2019-07-24 MED ORDER — VALACYCLOVIR HCL 1 G PO TABS: 1000.0000 mg | ORAL_TABLET | Freq: Three times a day (TID) | ORAL | 0 refills | Status: DC

## 2019-07-24 NOTE — Assessment & Plan Note (Addendum)
Slightly elevated today however patient and I had a long discussion in regards to this and suspect poor sleep due to pain, NSAID use, and also pain itself has contributed to elevated blood pressure.  In the past, he has had well-controlled blood pressure.  Deferred changing blood pressure medication changes today.  Patient will monitor blood pressure at home

## 2019-07-24 NOTE — Patient Instructions (Addendum)
Take gabapentin 100 mg in the morning, 100 mg midday, and 300 mg in the evening.  We can certainly increase this dosing if your pain is not better controlled. Please start Valtrex and let me know the next 12 to 24 hours if you feel that we are heading in the right direction. We may trial over-the-counter capsaicin which is a cream may be helping hot peppers as discussed.  You may use this after the blisters have completely resolved.  This can help with postherpetic pain.   Shingles  Shingles, which is also known as herpes zoster, is an infection that causes a painful skin rash and fluid-filled blisters. It is caused by a virus. Shingles only develops in people who:  Have had chickenpox.  Have been given a medicine to protect against chickenpox (have been vaccinated). Shingles is rare in this group. What are the causes? Shingles is caused by varicella-zoster virus (VZV). This is the same virus that causes chickenpox. After a person is exposed to VZV, the virus stays in the body in an inactive (dormant) state. Shingles develops if the virus is reactivated. This can happen many years after the first (initial) exposure to VZV. It is not known what causes this virus to be reactivated. What increases the risk? People who have had chickenpox or received the chickenpox vaccine are at risk for shingles. Shingles infection is more common in people who:  Are older than age 64.  Have a weakened disease-fighting system (immune system), such as people with: ? HIV. ? AIDS. ? Cancer.  Are taking medicines that weaken the immune system, such as transplant medicines.  Are experiencing a lot of stress. What are the signs or symptoms? Early symptoms of this condition include itching, tingling, and pain in an area on your skin. Pain may be described as burning, stabbing, or throbbing. A few days or weeks after early symptoms start, a painful red rash appears. The rash is usually on one side of the body and  has a band-like or belt-like pattern. The rash eventually turns into fluid-filled blisters that break open, change into scabs, and dry up in about 2-3 weeks. At any time during the infection, you may also develop:  A fever.  Chills.  A headache.  An upset stomach. How is this diagnosed? This condition is diagnosed with a skin exam. Skin or fluid samples may be taken from the blisters before a diagnosis is made. These samples are examined under a microscope or sent to a lab for testing. How is this treated? The rash may last for several weeks. There is not a specific cure for this condition. Your health care provider will probably prescribe medicines to help you manage pain, recover more quickly, and avoid long-term problems. Medicines may include:  Antiviral drugs.  Anti-inflammatory drugs.  Pain medicines.  Anti-itching medicines (antihistamines). If the area involved is on your face, you may be referred to a specialist, such as an eye doctor (ophthalmologist) or an ear, nose, and throat (ENT) doctor (otolaryngologist) to help you avoid eye problems, chronic pain, or disability. Follow these instructions at home: Medicines  Take over-the-counter and prescription medicines only as told by your health care provider.  Apply an anti-itch cream or numbing cream to the affected area as told by your health care provider. Relieving itching and discomfort   Apply cold, wet cloths (cold compresses) to the area of the rash or blisters as told by your health care provider.  Cool baths can be soothing. Try  adding baking soda or dry oatmeal to the water to reduce itching. Do not bathe in hot water. Blister and rash care  Keep your rash covered with a loose bandage (dressing). Wear loose-fitting clothing to help ease the pain of material rubbing against the rash.  Keep your rash and blisters clean by washing the area with mild soap and cool water as told by your health care  provider.  Check your rash every day for signs of infection. Check for: ? More redness, swelling, or pain. ? Fluid or blood. ? Warmth. ? Pus or a bad smell.  Do not scratch your rash or pick at your blisters. To help avoid scratching: ? Keep your fingernails clean and cut short. ? Wear gloves or mittens while you sleep, if scratching is a problem. General instructions  Rest as told by your health care provider.  Keep all follow-up visits as told by your health care provider. This is important.  Wash your hands often with soap and water. If soap and water are not available, use hand sanitizer. Doing this lowers your chance of getting a bacterial skin infection.  Before your blisters change into scabs, your shingles infection can cause chickenpox in people who have never had it or have never been vaccinated against it. To prevent this from happening, avoid contact with other people, especially: ? Babies. ? Pregnant women. ? Children who have eczema. ? Elderly people who have transplants. ? People who have chronic illnesses, such as cancer or AIDS. Contact a health care provider if:  Your pain is not relieved with prescribed medicines.  Your pain does not get better after the rash heals.  You have signs of infection in the rash area, such as: ? More redness, swelling, or pain around the rash. ? Fluid or blood coming from the rash. ? The rash area feeling warm to the touch. ? Pus or a bad smell coming from the rash. Get help right away if:  The rash is on your face or nose.  You have facial pain, pain around your eye area, or loss of feeling on one side of your face.  You have difficulty seeing.  You have ear pain or have ringing in your ear.  You have a loss of taste.  Your condition gets worse. Summary  Shingles, which is also known as herpes zoster, is an infection that causes a painful skin rash and fluid-filled blisters.  This condition is diagnosed with a skin  exam. Skin or fluid samples may be taken from the blisters and examined before the diagnosis is made.  Keep your rash covered with a loose bandage (dressing). Wear loose-fitting clothing to help ease the pain of material rubbing against the rash.  Before your blisters change into scabs, your shingles infection can cause chickenpox in people who have never had it or have never been vaccinated against it. This information is not intended to replace advice given to you by your health care provider. Make sure you discuss any questions you have with your health care provider. Document Revised: 05/27/2018 Document Reviewed: 10/07/2016 Elsevier Patient Education  2020 [employer].

## 2019-07-24 NOTE — Progress Notes (Signed)
Subjective:    Patient ID: Jeffrey Yang, male    DOB: [birth year], 64 y.o.   MRN: [id number]  CC: Jeffrey Yang is a 64 y.o. male who presents today for an acute visit.    HPI: Acute visit with chief complaint of left arm painful rash.  He describes that a week ago he was lying on the grass when 2 small bumps appears. He thought  bug bites on his medial left upper arm.  In the next couple days he noticed that the bumps started to spread and he had lesions more proximal to left shoulder and now has more recently noticed them extending to his ventral side of his left palm.  He describes them as painful, he does have intermittent numbness and tingling.  Nonpruritic.  No purulent discharge noted.  He does have h/o chickenpox as child.  In 1982 thought he had shingles on his face.  He endorses chills a couple nights ago which is since resolved.  denies any nausea, vomiting, dysuria, headache vision changes, chest pain constipation.  He has been taking gabapentin for chronic back pain which has been somewhat helpful for left arm pain.  Also been taking Advil. Hypertension-compliant with medications.  Typically at home blood pressures run 127/72.  He suspects poor sleep due to pain and pain itself affecting blood pressure.    Did not receive trazodone at the pharmacy, he would like this resent.   HISTORY:  Past Medical History:  Diagnosis Date  . Arthritis   . Depression   . GERD (gastroesophageal reflux disease)   . Hypertension   . Polyp of colon    Past Surgical History:  Procedure Laterality Date  . LUMBAR SPINE SURGERY  1990   Dr Quentin Cornwall neurosurgery  . LUMBAR SPINE SURGERY  2001   Dr Astrid Drafts  . NECK SURGERY  1994, 1997, 2001   plate and 2 screws in neck; 2001 cerical fusion Dr Doristine Counter  . OTHER SURGICAL HISTORY    . TONSILLECTOMY  1965   Family History  Problem Relation Age of Onset  . Hypertension Father     Allergies: Patient has no known allergies. Current Outpatient  Medications on File Prior to Visit  Medication Sig Dispense Refill  . amLODipine (NORVASC) 10 MG tablet Take 1 tablet (10 mg total) by mouth daily. 90 tablet 3  . cyclobenzaprine (FLEXERIL) 10 MG tablet TAKE 1/2 TABLET BY MOUTH 3 TIMES DAILY AS NEEDED FOR MUSCLE SPASMS. 30 tablet 0  . gabapentin (NEURONTIN) 100 MG capsule Take 1 capsule (100 mg total) by mouth 3 (three) times daily. 90 capsule 3  . nebivolol (BYSTOLIC) 5 MG tablet Take 1 tablet (5 mg total) by mouth daily. 90 tablet 1  . rosuvastatin (CRESTOR) 10 MG tablet Take 1 tablet (10 mg total) by mouth daily. 90 tablet 3   No current facility-administered medications on file prior to visit.    Social History   Tobacco Use  . Smoking status: Never Smoker  . Smokeless tobacco: Never Used  Substance Use Topics  . Alcohol use: Yes  . Drug use: Never    Review of Systems  Constitutional: Negative for chills (resolved) and fever.  Respiratory: Negative for cough.   Cardiovascular: Negative for chest pain and palpitations.  Gastrointestinal: Negative for nausea and vomiting.  Skin: Positive for rash.      Objective:    BP 140/66 (BP Location: Right Arm, Patient Position: Sitting)   Pulse 60   Temp 97.6 F (  36.4 C)   Ht 5' 7.99" (1.727 m)   Wt 181 lb (82.1 kg)   SpO2 99%   BMI 27.53 kg/m   BP Readings from Last 3 Encounters:  07/24/19 140/66  04/28/19 120/76  11/16/18 124/70    Physical Exam Vitals reviewed.  Constitutional:      Appearance: He is well-developed.  Cardiovascular:     Rate and Rhythm: Regular rhythm.     Heart sounds: Normal heart sounds.  Pulmonary:     Effort: Pulmonary effort is normal. No respiratory distress.     Breath sounds: Normal breath sounds. No wheezing, rhonchi or rales.  Skin:    General: Skin is warm and dry.          Comments: Grouped vesicular lesions as noted on diagram, left posterior upper arm, left medial forearm.  No red streaks, increased heat or purulent discharge    Neurological:     Mental Status: He is alert.  Psychiatric:        Speech: Speech normal.        Behavior: Behavior normal.        Assessment & Plan:   Problem List Items Addressed This Visit      Cardiovascular and Mediastinum   Essential hypertension    Slightly elevated today however patient and I had a long discussion in regards to this and suspect poor sleep due to pain, NSAID use, and also pain itself has contributed to elevated blood pressure.  In the past, he has had well-controlled blood pressure.  Deferred changing blood pressure medication changes today.  Patient will monitor blood pressure at home      Relevant Orders   Lipid panel   CBC with Differential/Platelet   Hemoglobin A1c     Musculoskeletal and Integument   Rash - Primary    Consistent with herpes zoster, will start Valtrex.  We will increase nighttime dose of gabapentin 300 mg .  patient will use over-the-counter capsaicin once rash has healed.  Patient will let me know of any  new or worsening features      Relevant Medications   valACYclovir (VALTREX) 1000 MG tablet     Other   Insomnia    Unfortunately, patient never tried trazodone.  I have resent this medication today.  Patient will let me know how he does      Relevant Medications   traZODone (DESYREL) 50 MG tablet         I am having [name] start on valACYclovir. I am also having him maintain his gabapentin, amLODipine, nebivolol, rosuvastatin, cyclobenzaprine, and traZODone.   Meds ordered this encounter  Medications  . valACYclovir (VALTREX) 1000 MG tablet    Sig: Take 1 tablet (1,000 mg total) by mouth 3 (three) times daily.    Dispense:  21 tablet    Refill:  0    Order Specific Question:   Supervising Provider    Answer:   Deborra Medina L [[id number]]  . traZODone (DESYREL) 50 MG tablet    Sig: Take 0.5-1 tablets (25-50 mg total) by mouth at bedtime as needed for sleep.    Dispense:  30 tablet    Refill:  3    Order  Specific Question:   Supervising Provider    Answer:   Crecencio Mc [[id number]]    Return precautions given.   Risks, benefits, and alternatives of the medications and treatment plan prescribed today were discussed, and patient expressed understanding.   Education regarding  symptom management and diagnosis given to patient on AVS.  Continue to follow with Burnard Hawthorne, FNP for routine health maintenance.   [name] and I agreed with plan.   Mable Paris, FNP

## 2019-07-24 NOTE — Assessment & Plan Note (Signed)
Unfortunately, patient never tried trazodone.  I have resent this medication today.  Patient will let me know how he does

## 2019-07-24 NOTE — Assessment & Plan Note (Addendum)
Consistent with herpes zoster, will start Valtrex.  We will increase nighttime dose of gabapentin 300 mg .  patient will use over-the-counter capsaicin once rash has healed.  Patient will let me know of any  new or worsening features

## 2019-07-31 ENCOUNTER — Ambulatory Visit: Payer: Medicare Other | Admitting: Family

## 2019-07-31 ENCOUNTER — Other Ambulatory Visit: Payer: Self-pay | Admitting: Family

## 2019-07-31 ENCOUNTER — Ambulatory Visit (INDEPENDENT_AMBULATORY_CARE_PROVIDER_SITE_OTHER): Payer: Medicare Other | Admitting: Family

## 2019-07-31 ENCOUNTER — Other Ambulatory Visit: Payer: Self-pay

## 2019-07-31 DIAGNOSIS — R21 Rash and other nonspecific skin eruption: Secondary | ICD-10-CM

## 2019-07-31 DIAGNOSIS — M545 Low back pain, unspecified: Secondary | ICD-10-CM

## 2019-07-31 DIAGNOSIS — I10 Essential (primary) hypertension: Secondary | ICD-10-CM | POA: Diagnosis not present

## 2019-07-31 DIAGNOSIS — M5441 Lumbago with sciatica, right side: Secondary | ICD-10-CM

## 2019-07-31 MED ORDER — NEBIVOLOL HCL 5 MG PO TABS: 2.5000 mg | ORAL_TABLET | Freq: Every day | ORAL | 1 refills | Status: DC

## 2019-07-31 NOTE — Patient Instructions (Signed)
Trial gabapentin 200mg  at bedtime Trial hold lipitor for one week and see if low back ache doesn't resolve. If does, you may restart once per week and stay there for a couple of weeks. Then try to move to twice per week and then three times per weeks, slowly as symptom allows.   Let me know if rash doesn't continue to improve

## 2019-07-31 NOTE — Assessment & Plan Note (Signed)
Improved.  Well-controlled at home patient and patient will send blood pressure readings from home as I suspect his blood pressure regimen is adequate

## 2019-07-31 NOTE — Progress Notes (Signed)
Subjective:    Patient ID: KAMEREN PARGAS, male    DOB: 1955-10-04, 64 y.o.   MRN: [id number]  CC: BRADSHAW MINIHAN is a 64 y.o. male who presents today for follow up.   HPI: Rash is improving .lesions are drying up.  Pain is improving and feels relief on gabapentin.  No fever, N, vomiting.   HTN- at home 120/80. Compliant with medications , takes bystolic 2.5mg  and amlodipine 10 mg.   Low back pain x one week, described as dull ache. More noticeable at bedtime when laying down.   No dysuria, urinary hesistancy, hematuria  Lifting granddaughter as he watches her. No other strenous activity. Hasnt tried any medication for this.      HISTORY:  Past Medical History:  Diagnosis Date  . Arthritis   . Depression   . GERD (gastroesophageal reflux disease)   . Hypertension   . Polyp of colon    Past Surgical History:  Procedure Laterality Date  . LUMBAR SPINE SURGERY  1990   Dr Quentin Cornwall neurosurgery  . LUMBAR SPINE SURGERY  2001   Dr Astrid Drafts  . NECK SURGERY  1994, 1997, 2001   plate and 2 screws in neck; 2001 cerical fusion Dr Doristine Counter  . OTHER SURGICAL HISTORY    . TONSILLECTOMY  1965   Family History  Problem Relation Age of Onset  . Hypertension Father     Allergies: Patient has no known allergies. Current Outpatient Medications on File Prior to Visit  Medication Sig Dispense Refill  . amLODipine (NORVASC) 10 MG tablet Take 1 tablet (10 mg total) by mouth daily. 90 tablet 3  . cyclobenzaprine (FLEXERIL) 10 MG tablet TAKE 1/2 TABLET BY MOUTH 3 TIMES DAILY AS NEEDED FOR MUSCLE SPASMS. 30 tablet 0  . gabapentin (NEURONTIN) 100 MG capsule Take 1 capsule (100 mg total) by mouth 3 (three) times daily. 90 capsule 3  . rosuvastatin (CRESTOR) 10 MG tablet Take 1 tablet (10 mg total) by mouth daily. 90 tablet 3  . traZODone (DESYREL) 50 MG tablet Take 0.5-1 tablets (25-50 mg total) by mouth at bedtime as needed for sleep. 30 tablet 3  . valACYclovir (VALTREX) 1000 MG tablet Take 1  tablet (1,000 mg total) by mouth 3 (three) times daily. 21 tablet 0   No current facility-administered medications on file prior to visit.    Social History   Tobacco Use  . Smoking status: Never Smoker  . Smokeless tobacco: Never Used  Vaping Use  . Vaping Use: Never used  Substance Use Topics  . Alcohol use: Yes  . Drug use: Never    Review of Systems  Constitutional: Negative for chills and fever.  Respiratory: Negative for cough.   Cardiovascular: Negative for chest pain and palpitations.  Gastrointestinal: Negative for nausea and vomiting.  Genitourinary: Negative for difficulty urinating and hematuria.  Musculoskeletal: Positive for back pain.  Skin: Positive for rash (improving).  Neurological: Negative for numbness.      Objective:    BP 128/68   Pulse 74   Temp 97.9 F (36.6 C)   Resp 16   Ht 5\' 7"  (1.702 m)   Wt 181 lb 12.8 oz (82.5 kg)   SpO2 99%   BMI 28.47 kg/m  BP Readings from Last 3 Encounters:  07/31/19 128/68  07/24/19 140/66  04/28/19 120/76   Wt Readings from Last 3 Encounters:  07/31/19 181 lb 12.8 oz (82.5 kg)  07/24/19 181 lb (82.1 kg)  04/28/19 186 lb 6.4  oz (84.6 kg)    Physical Exam Vitals reviewed.  Constitutional:      Appearance: He is well-developed.  Cardiovascular:     Rate and Rhythm: Regular rhythm.     Heart sounds: Normal heart sounds.  Pulmonary:     Effort: Pulmonary effort is normal. No respiratory distress.     Breath sounds: Normal breath sounds. No wheezing or rales.  Musculoskeletal:     Lumbar back: No swelling, spasms or tenderness. Normal range of motion.     Comments: Full range of motion with flexion, extension, lateral side bends. No pain, numbness, tingling elicited with single leg raise bilaterally. No rash.  Skin:    General: Skin is warm and dry.     Comments: Dried scabs noted posterior upper arm and forearm.  No red streaking, increased heat or erythema  Neurological:     Mental Status: He is  alert.  Psychiatric:        Speech: Speech normal.        Behavior: Behavior normal.        Assessment & Plan:   Problem List Items Addressed This Visit      Cardiovascular and Mediastinum   Essential hypertension    Improved.  Well-controlled at home patient and patient will send blood pressure readings from home as I suspect his blood pressure regimen is adequate      Relevant Medications   nebivolol (BYSTOLIC) 5 MG tablet     Musculoskeletal and Integument   Rash    Please see improved.  Patient will continue medications        Other   Low back pain    Acute on chronic.  Advised try heat, ibuprofen and increase gabapentin.  Also trial stop Lipitor to see if this is playing a role.  He will let me know how he is doing          I have changed Zamere L. Roam's nebivolol. I am also having him maintain his gabapentin, amLODipine, rosuvastatin, cyclobenzaprine, valACYclovir, and traZODone.   Meds ordered this encounter  Medications  . nebivolol (BYSTOLIC) 5 MG tablet    Sig: Take 0.5 tablets (2.5 mg total) by mouth daily.    Dispense:  90 tablet    Refill:  1    Order Specific Question:   Supervising Provider    Answer:   Crecencio Mc [[id number]]    Return precautions given.   Risks, benefits, and alternatives of the medications and treatment plan prescribed today were discussed, and patient expressed understanding.   Education regarding symptom management and diagnosis given to patient on AVS.  Continue to follow with Burnard Hawthorne, FNP for routine health maintenance.   [name] and I agreed with plan.   Mable Paris, FNP

## 2019-07-31 NOTE — Assessment & Plan Note (Signed)
Please see improved.  Patient will continue medications

## 2019-07-31 NOTE — Assessment & Plan Note (Signed)
Acute on chronic.  Advised try heat, ibuprofen and increase gabapentin.  Also trial stop Lipitor to see if this is playing a role.  He will let me know how he is doing

## 2019-08-08 ENCOUNTER — Telehealth: Payer: Self-pay | Admitting: Family

## 2019-08-08 NOTE — Telephone Encounter (Signed)
Informed the Patient of the below. He will go be seen at urgent care, possibly tomorrow

## 2019-08-08 NOTE — Telephone Encounter (Signed)
Yes, please advise UC

## 2019-08-08 NOTE — Telephone Encounter (Signed)
Please advise, Patient needs to be evaluated by walk-in?

## 2019-08-08 NOTE — Telephone Encounter (Signed)
Pt has developed new rash on chest. States that it is not shingles. He states that it itches. There are no appts available. Please advise

## 2019-08-11 ENCOUNTER — Ambulatory Visit (INDEPENDENT_AMBULATORY_CARE_PROVIDER_SITE_OTHER): Payer: Medicare Other

## 2019-08-11 VITALS — Ht 67.0 in | Wt 181.0 lb

## 2019-08-11 DIAGNOSIS — Z0000 Encounter for general adult medical examination without abnormal findings: Secondary | ICD-10-CM | POA: Diagnosis not present

## 2019-08-11 NOTE — Patient Instructions (Addendum)
Jeffrey Yang , Thank you for taking time to come for your Medicare Wellness Visit. I appreciate your ongoing commitment to your health goals. Please review the following plan we discussed and let me know if I can assist you in the future.   These are the goals we discussed: Goals    . Follow up with Primary Care Provider     As needed       This is a list of the screening recommended for you and due dates:  Health Maintenance  Topic Date Due  . HIV Screening  Never done  . Colon Cancer Screening  Never done  . Flu Shot  09/17/2019  . Tetanus Vaccine  07/31/2021  . COVID-19 Vaccine  Completed  .  Hepatitis C: One time screening is recommended by Center for Disease Control  (CDC) for  adults born from [birth year] through 1965.   Completed    Immunizations Immunization History  Administered Date(s) Administered  . Influenza,inj,Quad PF,6+ Mos 11/12/2018  . PFIZER SARS-COV-2 Vaccination 05/12/2019, 06/06/2019   Advanced directives: declined  Conditions/risks identified: none new  Follow up in one year for your annual wellness visit   Keep all routine maintenance appointments.   Follow up 02/16/20 @ 8:00  Colonoscopy- patient call back and schedule in September.    Preventive Care 40-64 Years, Male Preventive care refers to lifestyle choices and visits with your health care provider that can promote health and wellness. What does preventive care include?  A yearly physical exam. This is also called an annual well check.  Dental exams once or twice a year.  Routine eye exams. Ask your health care provider how often you should have your eyes checked.  Personal lifestyle choices, including:  Daily care of your teeth and gums.  Regular physical activity.  Eating a healthy diet.  Avoiding tobacco and drug use.  Limiting alcohol use.  Practicing safe sex.  Taking low-dose aspirin every day starting at age 80. What happens during an annual well check? The services and  screenings done by your health care provider during your annual well check will depend on your age, overall health, lifestyle risk factors, and family history of disease. Counseling  Your health care provider may ask you questions about your:  Alcohol use.  Tobacco use.  Drug use.  Emotional well-being.  Home and relationship well-being.  Sexual activity.  Eating habits.  Work and work Statistician. Screening  You may have the following tests or measurements:  Height, weight, and BMI.  Blood pressure.  Lipid and cholesterol levels. These may be checked every 5 years, or more frequently if you are over 51 years old.  Skin check.  Lung cancer screening. You may have this screening every year starting at age 41 if you have a 30-pack-year history of smoking and currently smoke or have quit within the past 15 years.  Fecal occult blood test (FOBT) of the stool. You may have this test every year starting at age 59.  Flexible sigmoidoscopy or colonoscopy. You may have a sigmoidoscopy every 5 years or a colonoscopy every 10 years starting at age 85.  Prostate cancer screening. Recommendations will vary depending on your family history and other risks.  Hepatitis C blood test.  Hepatitis B blood test.  Sexually transmitted disease (STD) testing.  Diabetes screening. This is done by checking your blood sugar (glucose) after you have not eaten for a while (fasting). You may have this done every 1-3 years. Discuss your test results,  treatment options, and if necessary, the need for more tests with your health care provider. Vaccines  Your health care provider may recommend certain vaccines, such as:  Influenza vaccine. This is recommended every year.  Tetanus, diphtheria, and acellular pertussis (Tdap, Td) vaccine. You may need a Td booster every 10 years.  Zoster vaccine. You may need this after age 53.  Pneumococcal 13-valent conjugate (PCV13) vaccine. You may need this if  you have certain conditions and have not been vaccinated.  Pneumococcal polysaccharide (PPSV23) vaccine. You may need one or two doses if you smoke cigarettes or if you have certain conditions. Talk to your health care provider about which screenings and vaccines you need and how often you need them. This information is not intended to replace advice given to you by your health care provider. Make sure you discuss any questions you have with your health care provider. Document Released: 03/01/2015 Document Revised: 10/23/2015 Document Reviewed: 12/04/2014 Elsevier Interactive Patient Education  2017 [place] Prevention in the Home Falls can cause injuries. They can happen to people of all ages. There are many things you can do to make your home safe and to help prevent falls. What can I do on the outside of my home?  Regularly fix the edges of walkways and driveways and fix any cracks.  Remove anything that might make you trip as you walk through a door, such as a raised step or threshold.  Trim any bushes or trees on the path to your home.  Use bright outdoor lighting.  Clear any walking paths of anything that might make someone trip, such as rocks or tools.  Regularly check to see if handrails are loose or broken. Make sure that both sides of any steps have handrails.  Any raised decks and porches should have guardrails on the edges.  Have any leaves, snow, or ice cleared regularly.  Use sand or salt on walking paths during winter.  Clean up any spills in your garage right away. This includes oil or grease spills. What can I do in the bathroom?  Use night lights.  Install grab bars by the toilet and in the tub and shower. Do not use towel bars as grab bars.  Use non-skid mats or decals in the tub or shower.  If you need to sit down in the shower, use a plastic, non-slip stool.  Keep the floor dry. Clean up any water that spills on the floor as soon as it  happens.  Remove soap buildup in the tub or shower regularly.  Attach bath mats securely with double-sided non-slip rug tape.  Do not have throw rugs and other things on the floor that can make you trip. What can I do in the bedroom?  Use night lights.  Make sure that you have a light by your bed that is easy to reach.  Do not use any sheets or blankets that are too big for your bed. They should not hang down onto the floor.  Have a firm chair that has side arms. You can use this for support while you get dressed.  Do not have throw rugs and other things on the floor that can make you trip. What can I do in the kitchen?  Clean up any spills right away.  Avoid walking on wet floors.  Keep items that you use a lot in easy-to-reach places.  If you need to reach something above you, use a strong step stool that has  a grab bar.  Keep electrical cords out of the way.  Do not use floor polish or wax that makes floors slippery. If you must use wax, use non-skid floor wax.  Do not have throw rugs and other things on the floor that can make you trip. What can I do with my stairs?  Do not leave any items on the stairs.  Make sure that there are handrails on both sides of the stairs and use them. Fix handrails that are broken or loose. Make sure that handrails are as long as the stairways.  Check any carpeting to make sure that it is firmly attached to the stairs. Fix any carpet that is loose or worn.  Avoid having throw rugs at the top or bottom of the stairs. If you do have throw rugs, attach them to the floor with carpet tape.  Make sure that you have a light switch at the top of the stairs and the bottom of the stairs. If you do not have them, ask someone to add them for you. What else can I do to help prevent falls?  Wear shoes that:  Do not have high heels.  Have rubber bottoms.  Are comfortable and fit you well.  Are closed at the toe. Do not wear sandals.  If you  use a stepladder:  Make sure that it is fully opened. Do not climb a closed stepladder.  Make sure that both sides of the stepladder are locked into place.  Ask someone to hold it for you, if possible.  Clearly mark and make sure that you can see:  Any grab bars or handrails.  First and last steps.  Where the edge of each step is.  Use tools that help you move around (mobility aids) if they are needed. These include:  Canes.  Walkers.  Scooters.  Crutches.  Turn on the lights when you go into a dark area. Replace any light bulbs as soon as they burn out.  Set up your furniture so you have a clear path. Avoid moving your furniture around.  If any of your floors are uneven, fix them.  If there are any pets around you, be aware of where they are.  Review your medicines with your doctor. Some medicines can make you feel dizzy. This can increase your chance of falling. Ask your doctor what other things that you can do to help prevent falls. This information is not intended to replace advice given to you by your health care provider. Make sure you discuss any questions you have with your health care provider. Document Released: 11/29/2008 Document Revised: 07/11/2015 Document Reviewed: 03/09/2014 Elsevier Interactive Patient Education  2017 [employer].

## 2019-08-11 NOTE — Progress Notes (Addendum)
Subjective:   Jeffrey Yang is a 64 y.o. male who presents for an Initial Medicare Annual Wellness Visit.  Review of Systems    No ROS.  Medicare Wellness Virtual Visit.   Cardiac Risk Factors include: hypertension;male gender     Objective:    Today's Vitals   08/11/19 1235  Weight: 181 lb (82.1 kg)  Height: 5\' 7"  (1.702 m)   Body mass index is 28.35 kg/m.  Advanced Directives 08/11/2019  Does Patient Have a Medical Advance Directive? No  Would patient like information on creating a medical advance directive? No - Patient declined    Current Medications (verified) Outpatient Encounter Medications as of 08/11/2019  Medication Sig  . amLODipine (NORVASC) 10 MG tablet Take 1 tablet (10 mg total) by mouth daily.  . cyclobenzaprine (FLEXERIL) 10 MG tablet TAKE 1/2 TABLET BY MOUTH 3 TIMES DAILY AS NEEDED FOR MUSCLE SPASMS.  Marland Kitchen gabapentin (NEURONTIN) 100 MG capsule Take 1 capsule (100 mg total) by mouth 3 (three) times daily.  . nebivolol (BYSTOLIC) 5 MG tablet Take 0.5 tablets (2.5 mg total) by mouth daily.  . rosuvastatin (CRESTOR) 10 MG tablet Take 1 tablet (10 mg total) by mouth daily.  . traZODone (DESYREL) 50 MG tablet Take 0.5-1 tablets (25-50 mg total) by mouth at bedtime as needed for sleep.  . valACYclovir (VALTREX) 1000 MG tablet TAKE 1 TABLET BY MOUTH THREE TIMES A DAY   No facility-administered encounter medications on file as of 08/11/2019.   Allergies (verified) Patient has no known allergies.   History: Past Medical History:  Diagnosis Date  . Arthritis   . Depression   . GERD (gastroesophageal reflux disease)   . Hypertension   . Polyp of colon    Past Surgical History:  Procedure Laterality Date  . LUMBAR SPINE SURGERY  1990   Dr Jeffrey Yang neurosurgery  . LUMBAR SPINE SURGERY  2001   Dr Jeffrey Yang  . NECK SURGERY  1994, 1997, 2001   plate and 2 screws in neck; 2001 cerical fusion Dr Jeffrey Yang  . OTHER SURGICAL HISTORY    . TONSILLECTOMY  1965    Family History  Problem Relation Age of Onset  . Hypertension Father    Social History   Socioeconomic History  . Marital status: Married    Spouse name: Not on file  . Number of children: Not on file  . Years of education: Not on file  . Highest education level: Not on file  Occupational History  . Not on file  Tobacco Use  . Smoking status: Never Smoker  . Smokeless tobacco: Never Used  Vaping Use  . Vaping Use: Never used  Substance and Sexual Activity  . Alcohol use: Yes  . Drug use: Never  . Sexual activity: Not Currently  Other Topics Concern  . Not on file  Social History Narrative   2 girl grandchildren - 63, and 1 years   Wife is patient of mine      2003 - went on disability   Social Determinants of Radio broadcast assistant Strain:   . Difficulty of Paying Living Expenses:   Food Insecurity:   . Worried About Charity fundraiser in the Last Year:   . Arboriculturist in the Last Year:   Transportation Needs:   . Film/video editor (Medical):   Marland Kitchen Lack of Transportation (Non-Medical):   Physical Activity:   . Days of Exercise per Week:   . Minutes of Exercise  per Session:   Stress:   . Feeling of Stress :   Social Connections:   . Frequency of Communication with Friends and Family:   . Frequency of Social Gatherings with Friends and Family:   . Attends Religious Services:   . Active Member of Clubs or Organizations:   . Attends Archivist Meetings:   Marland Kitchen Marital Status:     Tobacco Counseling Counseling given: Not Answered   Clinical Intake:  Pre-visit preparation completed: Yes        Diabetes: No  How often do you need to have someone help you when you read instructions, pamphlets, or other written materials from your doctor or pharmacy?: 1 - Never  Interpreter Needed?: No    Activities of Daily Living In your present state of health, do you have any difficulty performing the following activities: 08/11/2019  04/28/2019  Hearing? N N  Vision? N N  Difficulty concentrating or making decisions? N N  Walking or climbing stairs? N N  Dressing or bathing? N N  Doing errands, shopping? N N  Preparing Food and eating ? N -  Using the Toilet? N -  In the past six months, have you accidently leaked urine? N -  Do you have problems with loss of bowel control? N -  Managing your Medications? N -  Managing your Finances? N -  Housekeeping or managing your Housekeeping? N -  Some recent data might be hidden    Patient Care Team: Jeffrey Hawthorne, FNP as PCP - General (Family Medicine)  Indicate any recent Medical Services you may have received from other than [facility] providers in the past year (date may be approximate).     Assessment:   This is a routine wellness examination for Jeffrey Yang.  I connected with Jeffrey Yang today by telephone and verified that I am speaking with the correct person using two identifiers. Location patient: home Location provider: work Persons participating in the virtual visit: patient, Marine scientist.    I discussed the limitations, risks, security and privacy concerns of performing an evaluation and management service by telephone and the availability of in person appointments. The patient expressed understanding and verbally consented to this telephonic visit.    Interactive audio and video telecommunications were attempted between this provider and patient, however failed, due to patient having technical difficulties OR patient did not have access to video capability.  We continued and completed visit with audio only.  Some vital signs may be absent or patient reported.   Hearing/Vision screen  Hearing Screening   125Hz  250Hz  500Hz  1000Hz  2000Hz  3000Hz  4000Hz  6000Hz  8000Hz   Right ear:           Left ear:           Comments: Patient is able to hear conversational tones without difficulty.  No issues reported.   Vision Screening Comments: Followed by [facility] Wears corrective  lenses Visual acuity not assessed, virtual visit.  They have seen their ophthalmologist in the last 12 months. Recommended annual ophthalmology exams for early detection of glaucoma and other disorders of the eye.  Dietary issues and exercise activities discussed: Current Exercise Habits: Home exercise routine, Intensity: MildRegular diet Good water intake   Goals    . Follow up with Primary Care Provider     As needed      Depression Screen [facility] 2/9 Scores 08/11/2019 07/24/2019 04/28/2019 11/16/2018 08/11/2018  PHQ - 2 Score 0 0 1 0 0  PHQ- 9 Score - -  5 - 0    Fall Risk Fall Risk  08/11/2019 07/24/2019 04/28/2019 11/16/2018  Falls in the past year? 0 0 0 0  Number falls in past yr: 0 0 - -  Injury with Fall? 0 0 - -  Follow up Falls evaluation completed Falls evaluation completed Falls evaluation completed Falls evaluation completed   Handrails in use when climbing stairs? Yes  Home free of loose throw rugs in walkways, pet beds, electrical cords, etc? Yes  Adequate lighting in your home to reduce risk of falls? Yes   ASSISTIVE DEVICES UTILIZED TO PREVENT FALLS:  Life alert? No  Use of a cane, walker or w/c? No  Grab bars in the bathroom? No  Shower chair or bench in shower? No  Elevated toilet seat or a handicapped toilet? No   TIMED UP AND GO:  Was the test performed? 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  Denies difficulty focusing, concentrating, memory loss, making decisions. Manages his own finances.    6CIT Screen 08/11/2019  What Year? 0 points  What month? 0 points  What time? 0 points   Immunizations Immunization History  Administered Date(s) Administered  . Influenza,inj,Quad PF,6+ Mos 11/12/2018  . PFIZER SARS-COV-2 Vaccination 05/12/2019, 06/06/2019  . Tdap 08/01/2011   TDAP  status: Due, Education has been provided regarding the importance of this vaccine. Advised may receive this vaccine at local pharmacy or Health Dept. Aware to provide a copy of the vaccination record if obtained from local pharmacy or Health Dept. Verbalized acceptance and understanding.   Zostavax completed No   Shingrix Completed?: No.    Education has been provided regarding the importance of this vaccine. Patient has been advised to call insurance company to determine out of pocket expense if they have not yet received this vaccine. Advised may also receive vaccine at local pharmacy or Health Dept. Verbalized acceptance and understanding.  Health Maintenance Health Maintenance  Topic Date Due  . HIV Screening  Never done  . COLONOSCOPY  Never done  . INFLUENZA VACCINE  09/17/2019  . TETANUS/TDAP  07/31/2021  . COVID-19 Vaccine  Completed  . Hepatitis C Screening  Completed   -HIV screening- all labs followed by pcp  Colonoscopy- deferred per patient until September.   Lung Cancer Screening: (Low Dose CT Chest recommended if Age 52-80 years, 30 pack-year currently smoking OR have quit w/in 15years.) does not qualify.   Dental Screening: Recommended annual dental exams for proper oral hygiene  Community Resource Referral / Chronic Care Management: CRR required this visit?  No  CCM required this visit?  No    Plan:   Keep all routine maintenance appointments.   Follow up 02/16/20 @ 8:00  Colonoscopy- patient call back and schedule in September.   I have personally reviewed and noted the following in the patient's chart:   . Medical and social history . Use of alcohol, tobacco or illicit drugs  . Current medications and supplements . Functional ability and status . Nutritional status . Physical activity . Advanced directives . List of other physicians . Hospitalizations, surgeries, and ER visits in previous 12 months . Vitals . Screenings to include cognitive,  depression, and falls . Referrals and appointments  In addition, I have reviewed and discussed with patient certain preventive protocols, quality metrics, and best practice recommendations.  A written personalized care plan for preventive services as well as general preventive health recommendations were provided to patient via mychart.     Varney Biles, LPN   4/97/5300    Agree with plan. Mable Paris, NP

## 2019-08-22 ENCOUNTER — Other Ambulatory Visit: Payer: Self-pay | Admitting: Family

## 2019-08-22 DIAGNOSIS — G8929 Other chronic pain: Secondary | ICD-10-CM

## 2019-08-22 DIAGNOSIS — M5441 Lumbago with sciatica, right side: Secondary | ICD-10-CM

## 2019-09-04 ENCOUNTER — Ambulatory Visit: Payer: Medicare Other | Admitting: Family

## 2019-09-14 ENCOUNTER — Telehealth: Payer: Self-pay | Admitting: Family

## 2019-09-14 DIAGNOSIS — M5442 Lumbago with sciatica, left side: Secondary | ICD-10-CM

## 2019-09-14 MED ORDER — GABAPENTIN 100 MG PO CAPS: 100.0000 mg | ORAL_CAPSULE | Freq: Three times a day (TID) | ORAL | 3 refills | Status: DC

## 2019-09-14 NOTE — Telephone Encounter (Signed)
Pt needs a refill on gabapentin (NEURONTIN) 100 MG capsule ASAP. Please call pt today when complete

## 2019-10-02 ENCOUNTER — Other Ambulatory Visit: Payer: Self-pay | Admitting: Family

## 2019-10-02 DIAGNOSIS — G8929 Other chronic pain: Secondary | ICD-10-CM

## 2019-10-31 ENCOUNTER — Other Ambulatory Visit: Payer: Self-pay | Admitting: Family

## 2019-10-31 DIAGNOSIS — M5441 Lumbago with sciatica, right side: Secondary | ICD-10-CM

## 2019-12-04 ENCOUNTER — Other Ambulatory Visit: Payer: Self-pay | Admitting: Family

## 2019-12-04 DIAGNOSIS — G8929 Other chronic pain: Secondary | ICD-10-CM

## 2019-12-04 DIAGNOSIS — M5442 Lumbago with sciatica, left side: Secondary | ICD-10-CM

## 2020-01-24 NOTE — Progress Notes (Signed)
Subjective:    Patient ID: Jeffrey Yang, male    DOB: [birth year], 64 y.o.   MRN: [id number]  CC: Jeffrey Yang is a 64 y.o. male who presents today for follow up.   HPI: HTN- he is not taking nebivolol 5mg  for last several months as had been expensive at last refill.  At  Home 130/80 on amlodipine.  Compliant with amlodipine.   HLD- compliant with crestor 10mg .  No myalgia.   Chronic low back pain- controlled with gabapentin.   Complains of chest tightness, over the past year. None today.  This may come in episodes or be present all day. This is more noticeable since stopped klonopin one year ago.  Thinks related to anxiety in particularly r/t  Two grandchildren whom he cares for.  Chest tightness is more present when grandchildren arrive and as he 'relaxes' chest tightness improves.  Depression this time of year due to cold weather and not being outside.  No si/hi. Sleeps well.  Symptom is not associated with diaphoresis exertional chest pain, numbness or tingling radiating to left arm or jaw, palpitations, dizziness, frequent headaches, changes in vision, or shortness of breath.   Walks and rides bike for exercise once per week without chest tightness.  No asthma. No wheezing. No h/o smoking.   No burping, epigastric burning.   Never been to cardiology in the past.   Due cholesterol , labs   HISTORY:  Past Medical History:  Diagnosis Date  . Arthritis   . Depression   . GERD (gastroesophageal reflux disease)   . Hypertension   . Polyp of colon    Past Surgical History:  Procedure Laterality Date  . LUMBAR SPINE SURGERY  1990   Dr Quentin Cornwall neurosurgery  . LUMBAR SPINE SURGERY  2001   Dr Astrid Drafts  . NECK SURGERY  1994, 1997, 2001   plate and 2 screws in neck; 2001 cerical fusion Dr Doristine Counter  . OTHER SURGICAL HISTORY    . TONSILLECTOMY  1965   Family History  Problem Relation Age of Onset  . Hypertension Father   . Brain cancer Father 65  . COPD Mother 19  .  Heart attack Neg Hx     Allergies: Patient has no known allergies. Current Outpatient Medications on File Prior to Visit  Medication Sig Dispense Refill  . amLODipine (NORVASC) 10 MG tablet Take 1 tablet (10 mg total) by mouth daily. 90 tablet 3  . cyclobenzaprine (FLEXERIL) 10 MG tablet TAKE 1/2 TABLET BY MOUTH 3 TIMES DAILY AS NEEDED FOR MUSCLE SPASMS. 30 tablet 1  . gabapentin (NEURONTIN) 100 MG capsule Take 1 capsule (100 mg total) by mouth 3 (three) times daily. 90 capsule 3  . rosuvastatin (CRESTOR) 10 MG tablet Take 1 tablet (10 mg total) by mouth daily. 90 tablet 3  . traZODone (DESYREL) 50 MG tablet Take 0.5-1 tablets (25-50 mg total) by mouth at bedtime as needed for sleep. 30 tablet 3  . valACYclovir (VALTREX) 1000 MG tablet TAKE 1 TABLET BY MOUTH THREE TIMES A DAY 21 tablet 0   No current facility-administered medications on file prior to visit.    Social History   Tobacco Use  . Smoking status: Never Smoker  . Smokeless tobacco: Never Used  Vaping Use  . Vaping Use: Never used  Substance Use Topics  . Alcohol use: Yes  . Drug use: Never    Review of Systems  Constitutional: Negative for chills and fever.  Respiratory: Positive for chest  tightness. Negative for cough and shortness of breath.   Cardiovascular: Negative for chest pain, palpitations and leg swelling.  Gastrointestinal: Negative for nausea and vomiting.  Psychiatric/Behavioral: Negative for sleep disturbance and suicidal ideas. The patient is nervous/anxious.       Objective:    BP 130/76   Pulse 62   Temp 97.8 F (36.6 C)   Ht 5\' 7"  (1.702 m)   Wt 174 lb 6.4 oz (79.1 kg)   SpO2 99%   BMI 27.31 kg/m  BP Readings from Last 3 Encounters:  01/29/20 130/76  07/31/19 128/68  07/24/19 140/66   Wt Readings from Last 3 Encounters:  01/29/20 174 lb 6.4 oz (79.1 kg)  08/11/19 181 lb (82.1 kg)  07/31/19 181 lb 12.8 oz (82.5 kg)    Physical Exam Vitals reviewed.  Constitutional:       Appearance: He is well-developed and well-nourished.  Cardiovascular:     Rate and Rhythm: Regular rhythm.     Heart sounds: Normal heart sounds.  Pulmonary:     Effort: Pulmonary effort is normal. No respiratory distress.     Breath sounds: Normal breath sounds. No wheezing, rhonchi or rales.  Skin:    General: Skin is warm and dry.  Neurological:     Mental Status: He is alert.  Psychiatric:        Mood and Affect: Mood and affect normal.        Speech: Speech normal.        Behavior: Behavior normal.        Assessment & Plan:   Problem List Items Addressed This Visit      Cardiovascular and Mediastinum   Essential hypertension    Uncontrolled. He will resume bystolic 5mg , continue amlodipine 10mg . Advised to call me if blood pressure not < 120/80        Other   Anxiety and depression    Uncontrolled. Continue trazodone 50mg , start zoloft 50mg . Close follow up.       Relevant Medications   sertraline (ZOLOFT) 50 MG tablet   Chest tightness    None today. Comes and goes. Presentation consistent with anxiety and discontinuation of prior use of klonopin. Discussed the benefit of daily SSRI. EKG performed as patient didn't have baseline and to evaluate for abnormalities. NSR with t wave inversion. No prior EKG to compare too. QTc 412.   Trial of zoloft.  In the absence of exertional cp, sob, Dr Derrel Nip and I agreed we did not need to consult cardiology at this time. Will certainly continue to follow patient and expressed for him to remain hypervigilant for any changes in his symptoms.   Case reviewed with supervising, Dr Deborra Medina, and she and I jointly agreed on management plan.        Relevant Medications   sertraline (ZOLOFT) 50 MG tablet   Other Relevant Orders   EKG 12-Lead (Completed)   HLD (hyperlipidemia)    Pending lipid panel, presume improved.continue crestor 10mg .       Relevant Orders   Lipid panel   Comprehensive metabolic panel    Other Visit  Diagnoses    Need for immunization against influenza    -  Primary   Relevant Orders   Flu Vaccine QUAD 36+ mos IM (Completed)       I am having [name] start on sertraline. I am also having him maintain his amLODipine, rosuvastatin, traZODone, valACYclovir, gabapentin, and cyclobenzaprine.   Meds ordered this encounter  Medications  .  sertraline (ZOLOFT) 50 MG tablet    Sig: Take 1 tablet (50 mg total) by mouth at bedtime.    Dispense:  90 tablet    Refill:  3    Order Specific Question:   Supervising Provider    Answer:   Crecencio Mc [[id number]]    Return precautions given.   Risks, benefits, and alternatives of the medications and treatment plan prescribed today were discussed, and patient expressed understanding.   Education regarding symptom management and diagnosis given to patient on AVS.  Continue to follow with Burnard Hawthorne, FNP for routine health maintenance.   [name] and I agreed with plan.   Mable Paris, FNP

## 2020-01-25 ENCOUNTER — Other Ambulatory Visit: Payer: Self-pay

## 2020-01-29 ENCOUNTER — Encounter: Payer: Self-pay | Admitting: Family

## 2020-01-29 ENCOUNTER — Other Ambulatory Visit: Payer: Self-pay

## 2020-01-29 ENCOUNTER — Telehealth: Payer: Self-pay | Admitting: Family

## 2020-01-29 ENCOUNTER — Ambulatory Visit (INDEPENDENT_AMBULATORY_CARE_PROVIDER_SITE_OTHER): Payer: Medicare Other | Admitting: Family

## 2020-01-29 VITALS — BP 130/76 | HR 62 | Temp 97.8°F | Ht 67.0 in | Wt 174.4 lb

## 2020-01-29 DIAGNOSIS — I10 Essential (primary) hypertension: Secondary | ICD-10-CM

## 2020-01-29 DIAGNOSIS — R0789 Other chest pain: Secondary | ICD-10-CM | POA: Diagnosis not present

## 2020-01-29 DIAGNOSIS — Z23 Encounter for immunization: Secondary | ICD-10-CM | POA: Diagnosis not present

## 2020-01-29 DIAGNOSIS — F419 Anxiety disorder, unspecified: Secondary | ICD-10-CM | POA: Diagnosis not present

## 2020-01-29 DIAGNOSIS — E785 Hyperlipidemia, unspecified: Secondary | ICD-10-CM

## 2020-01-29 DIAGNOSIS — F32A Depression, unspecified: Secondary | ICD-10-CM

## 2020-01-29 LAB — LIPID PANEL
Cholesterol: 102 mg/dL (ref 0–200)
HDL: 42.3 mg/dL (ref >39.00)
LDL Cholesterol: 41 mg/dL (ref 0–99)
NonHDL: 59.96
Total CHOL/HDL Ratio: 2
Triglycerides: 93 mg/dL (ref 0.0–149.0)
VLDL: 18.6 mg/dL (ref 0.0–40.0)

## 2020-01-29 LAB — COMPREHENSIVE METABOLIC PANEL
ALT: 14 U/L (ref 0–53)
AST: 17 U/L (ref 0–37)
Albumin: 4.6 g/dL (ref 3.5–5.2)
Alkaline Phosphatase: 102 U/L (ref 39–117)
BUN: 14 mg/dL (ref 6–23)
CO2: 27 mEq/L (ref 19–32)
Calcium: 9.8 mg/dL (ref 8.4–10.5)
Chloride: 103 mEq/L (ref 96–112)
Creatinine, Ser: 1.12 mg/dL (ref 0.40–1.50)
GFR: 69.52 mL/min (ref >60.00)
Glucose, Bld: 133 mg/dL — ABNORMAL HIGH (ref 70–99)
Potassium: 3.9 mEq/L (ref 3.5–5.1)
Sodium: 138 mEq/L (ref 135–145)
Total Bilirubin: 0.7 mg/dL (ref 0.2–1.2)
Total Protein: 7 g/dL (ref 6.0–8.3)

## 2020-01-29 MED ORDER — SERTRALINE HCL 50 MG PO TABS: 50.0000 mg | ORAL_TABLET | Freq: Every day | ORAL | 3 refills | Status: DC

## 2020-01-29 MED ORDER — NEBIVOLOL HCL 5 MG PO TABS: 2.5000 mg | ORAL_TABLET | Freq: Every day | ORAL | 1 refills | Status: DC

## 2020-01-29 NOTE — Telephone Encounter (Signed)
Pt called and informed of below. He will let us know if chest tightness does not improve with Zoloft.

## 2020-01-29 NOTE — Telephone Encounter (Signed)
Call pt  consulted with Dr Derrel Nip regarding EKG. We agreed as long as he remains asymptomatic - no exertional chest pain, sob, we can follow. No acute changes on EKG  If chest tightness however doesn't improve on zoloft, please ensure he calls to make an appt to address

## 2020-01-29 NOTE — Patient Instructions (Signed)
Trial of zoloft  Our hope is for gradual improvement of mood since starting medication; however this may take several weeks.   If you start to have unusual thoughts, thoughts of hurting yourself, or anyone else, please go immediately to the emergency department.   Follow up in 6 weeks.   Please text to [phone] and write the word 'home'. This will put you in touch with trained crisis counselor and resources.    National Suicide Prevention Hotline - available 24 hours a day, 7 days a week.  (607) 100-7533   Generalized Anxiety Disorder, Adult Generalized anxiety disorder (GAD) is a mental health disorder. People with this condition constantly worry about everyday events. Unlike normal anxiety, worry related to GAD is not triggered by a specific event. These worries also do not fade or get better with time. GAD interferes with life functions, including relationships, work, and school. GAD can vary from mild to severe. People with severe GAD can have intense waves of anxiety with physical symptoms (panic attacks). What are the causes? The exact cause of GAD is not known. What increases the risk? This condition is more likely to develop in:  Women.  People who have a family history of anxiety disorders.  People who are very shy.  People who experience very stressful life events, such as the death of a loved one.  People who have a very stressful family environment. What are the signs or symptoms? People with GAD often worry excessively about many things in their lives, such as their health and family. They may also be overly concerned about:  Doing well at work.  Being on time.  Natural disasters.  Friendships. Physical symptoms of GAD include:  Fatigue.  Muscle tension or having muscle twitches.  Trembling or feeling shaky.  Being easily startled.  Feeling like your heart is pounding or racing.  Feeling out of breath or like you cannot take a deep breath.  Having  trouble falling asleep or staying asleep.  Sweating.  Nausea, diarrhea, or irritable bowel syndrome (IBS).  Headaches.  Trouble concentrating or remembering facts.  Restlessness.  Irritability. How is this diagnosed? Your health care provider can diagnose GAD based on your symptoms and medical history. You will also have a physical exam. The health care provider will ask specific questions about your symptoms, including how severe they are, when they started, and if they come and go. Your health care provider may ask you about your use of alcohol or drugs, including prescription medicines. Your health care provider may refer you to a mental health specialist for further evaluation. Your health care provider will do a thorough examination and may perform additional tests to rule out other possible causes of your symptoms. To be diagnosed with GAD, a person must have anxiety that:  Is out of his or her control.  Affects several different aspects of his or her life, such as work and relationships.  Causes distress that makes him or her unable to take part in normal activities.  Includes at least three physical symptoms of GAD, such as restlessness, fatigue, trouble concentrating, irritability, muscle tension, or sleep problems. Before your health care provider can confirm a diagnosis of GAD, these symptoms must be present more days than they are not, and they must last for six months or longer. How is this treated? The following therapies are usually used to treat GAD:  Medicine. Antidepressant medicine is usually prescribed for long-term daily control. Antianxiety medicines may be added in severe  cases, especially when panic attacks occur.  Talk therapy (psychotherapy). Certain types of talk therapy can be helpful in treating GAD by providing support, education, and guidance. Options include: ? Cognitive behavioral therapy (CBT). People learn coping skills and techniques to ease their  anxiety. They learn to identify unrealistic or negative thoughts and behaviors and to replace them with positive ones. ? Acceptance and commitment therapy (ACT). This treatment teaches people how to be mindful as a way to cope with unwanted thoughts and feelings. ? Biofeedback. This process trains you to manage your body's response (physiological response) through breathing techniques and relaxation methods. You will work with a therapist while machines are used to monitor your physical symptoms.  Stress management techniques. These include yoga, meditation, and exercise. A mental health specialist can help determine which treatment is best for you. Some people see improvement with one type of therapy. However, other people require a combination of therapies. Follow these instructions at home:  Take over-the-counter and prescription medicines only as told by your health care provider.  Try to maintain a normal routine.  Try to anticipate stressful situations and allow extra time to manage them.  Practice any stress management or self-calming techniques as taught by your health care provider.  Do not punish yourself for setbacks or for not making progress.  Try to recognize your accomplishments, even if they are small.  Keep all follow-up visits as told by your health care provider. This is important. Contact a health care provider if:  Your symptoms do not get better.  Your symptoms get worse.  You have signs of depression, such as: ? A persistently sad, cranky, or irritable mood. ? Loss of enjoyment in activities that used to bring you joy. ? Change in weight or eating. ? Changes in sleeping habits. ? Avoiding friends or family members. ? Loss of energy for normal tasks. ? Feelings of guilt or worthlessness. Get help right away if:  You have serious thoughts about hurting yourself or others. If you ever feel like you may hurt yourself or others, or have thoughts about taking  your own life, get help right away. You can go to your nearest emergency department or call:  Your local emergency services (911 in the [place]  A suicide crisis helpline, such as the [facility] at (564) 500-7505. This is open 24 hours a day. Summary  Generalized anxiety disorder (GAD) is a mental health disorder that involves worry that is not triggered by a specific event.  People with GAD often worry excessively about many things in their lives, such as their health and family.  GAD may cause physical symptoms such as restlessness, trouble concentrating, sleep problems, frequent sweating, nausea, diarrhea, headaches, and trembling or muscle twitching.  A mental health specialist can help determine which treatment is best for you. Some people see improvement with one type of therapy. However, other people require a combination of therapies. This information is not intended to replace advice given to you by your health care provider. Make sure you discuss any questions you have with your health care provider. Document Revised: 01/15/2017 Document Reviewed: 12/24/2015 Elsevier Patient Education  2020 [place].   Major Depressive Disorder Major depressive disorder is a mental illness. It also may be called clinical depression or unipolar depression. Major depressive disorder usually causes feelings of sadness, hopelessness, or helplessness. Some people with this disorder do not feel particularly sad but lose interest in doing things they used to enjoy (anhedonia). Major depressive  disorder also can cause physical symptoms. It can interfere with work, school, relationships, and other normal everyday activities. The disorder varies in severity but is longer lasting and more serious than the sadness we all feel from time to time in our lives. Major depressive disorder often is triggered by stressful life events or major life changes. Examples of these triggers include  divorce, loss of your job or home, a move, and the death of a family member or close friend. Sometimes this disorder occurs for no obvious reason at all. People who have family members with major depressive disorder or bipolar disorder are at higher risk for developing this disorder, with or without life stressors. Major depressive disorder can occur at any age. It may occur just once in your life (single episode major depressive disorder). It may occur multiple times (recurrent major depressive disorder). SYMPTOMS People with major depressive disorder have either anhedonia or depressed mood on nearly a daily basis for at least 2 weeks or longer. Symptoms of depressed mood include:  Feelings of sadness (blue or down in the dumps) or emptiness.  Feelings of hopelessness or helplessness.  Tearfulness or episodes of crying (may be observed by others).  Irritability (children and adolescents). In addition to depressed mood or anhedonia or both, people with this disorder have at least four of the following symptoms:  Difficulty sleeping or sleeping too much.    Significant change (increase or decrease) in appetite or weight.    Lack of energy or motivation.  Feelings of guilt and worthlessness.    Difficulty concentrating, remembering, or making decisions.  Unusually slow movement (psychomotor retardation) or restlessness (as observed by others).    Recurrent wishes for death, recurrent thoughts of self-harm (suicide), or a suicide attempt. People with major depressive disorder commonly have persistent negative thoughts about themselves, other people, and the world. People with severe major depressive disorder may experience distorted beliefs or perceptions about the world (psychotic delusions). They also may see or hear things that are not real (psychotic hallucinations). DIAGNOSIS Major depressive disorder is diagnosed through an assessment by your health care provider. Your health care  provider will ask about aspects of your daily life, such as mood, sleep, and appetite, to see if you have the diagnostic symptoms of major depressive disorder. Your health care provider may ask about your medical history and use of alcohol or drugs, including prescription medicines. Your health care provider also may do a physical exam and blood work. This is because certain medical conditions and the use of certain substances can cause major depressive disorder-like symptoms (secondary depression). Your health care provider also may refer you to a mental health specialist for further evaluation and treatment. TREATMENT It is important to recognize the symptoms of major depressive disorder and seek treatment. The following treatments can be prescribed for this disorder:    Medicine. Antidepressant medicines usually are prescribed. Antidepressant medicines are thought to correct chemical imbalances in the brain that are commonly associated with major depressive disorder. Other types of medicine may be added if the symptoms do not respond to antidepressant medicines alone or if psychotic delusions or hallucinations occur.  Talk therapy. Talk therapy can be helpful in treating major depressive disorder by providing support, education, and guidance. Certain types of talk therapy also can help with negative thinking (cognitive behavioral therapy) and with relationship issues that trigger this disorder (interpersonal therapy). A mental health specialist can help determine which treatment is best for you. Most people with major depressive  disorder do well with a combination of medicine and talk therapy. Treatments involving electrical stimulation of the brain can be used in situations with extremely severe symptoms or when medicine and talk therapy do not work over time. These treatments include electroconvulsive therapy, transcranial magnetic stimulation, and vagal nerve stimulation.   This information is not  intended to replace advice given to you by your health care provider. Make sure you discuss any questions you have with your health care provider.   Document Released: 05/30/2012 Document Revised: 02/23/2014 Document Reviewed: 05/30/2012 Elsevier Interactive Patient Education [employer].

## 2020-01-29 NOTE — Assessment & Plan Note (Addendum)
None today. Comes and goes. Presentation consistent with anxiety and discontinuation of prior use of klonopin. Discussed the benefit of daily SSRI. EKG performed as patient didn't have baseline and to evaluate for abnormalities. NSR with t wave inversion. No prior EKG to compare too. QTc 412.   Trial of zoloft.  In the absence of exertional cp, sob, Dr Derrel Nip and I agreed we did not need to consult cardiology at this time. Will certainly continue to follow patient and expressed for him to remain hypervigilant for any changes in his symptoms.   Case reviewed with supervising, Dr Deborra Medina, and she and I jointly agreed on management plan.

## 2020-01-29 NOTE — Assessment & Plan Note (Signed)
Pending lipid panel, presume improved.continue crestor 10mg .

## 2020-01-29 NOTE — Assessment & Plan Note (Signed)
Uncontrolled. Continue trazodone 50mg , start zoloft 50mg . Close follow up.

## 2020-01-29 NOTE — Assessment & Plan Note (Signed)
Uncontrolled. He will resume bystolic 5mg , continue amlodipine 10mg . Advised to call me if blood pressure not < 120/80

## 2020-02-18 ENCOUNTER — Other Ambulatory Visit: Payer: Self-pay | Admitting: Family

## 2020-02-18 DIAGNOSIS — G8929 Other chronic pain: Secondary | ICD-10-CM

## 2020-02-19 NOTE — Telephone Encounter (Signed)
Okay to refill? 

## 2020-03-12 ENCOUNTER — Encounter: Payer: Self-pay | Admitting: Family

## 2020-03-12 ENCOUNTER — Ambulatory Visit (INDEPENDENT_AMBULATORY_CARE_PROVIDER_SITE_OTHER): Payer: Medicare Other | Admitting: Family

## 2020-03-12 ENCOUNTER — Other Ambulatory Visit: Payer: Self-pay

## 2020-03-12 VITALS — BP 130/70 | HR 65 | Temp 98.0°F | Ht 67.0 in | Wt 172.4 lb

## 2020-03-12 DIAGNOSIS — I10 Essential (primary) hypertension: Secondary | ICD-10-CM

## 2020-03-12 DIAGNOSIS — F32A Depression, unspecified: Secondary | ICD-10-CM | POA: Diagnosis not present

## 2020-03-12 DIAGNOSIS — M545 Low back pain, unspecified: Secondary | ICD-10-CM

## 2020-03-12 DIAGNOSIS — F419 Anxiety disorder, unspecified: Secondary | ICD-10-CM

## 2020-03-12 MED ORDER — DULOXETINE HCL 30 MG PO CPEP: ORAL_CAPSULE | ORAL | 3 refills | Status: DC

## 2020-03-12 NOTE — Patient Instructions (Signed)
STOP zoloft Start cymbalta.   Let me know how you are doing.

## 2020-03-12 NOTE — Assessment & Plan Note (Signed)
Chronic. Trial of cymbalta.

## 2020-03-12 NOTE — Assessment & Plan Note (Signed)
Stable. Continue bystolic 5mg , compliant with amlodipine 10mg .

## 2020-03-12 NOTE — Progress Notes (Signed)
Subjective:    Patient ID: Jeffrey Yang, male    DOB: Aug 08, 1955, 65 y.o.   MRN: [id number]  CC: Jeffrey Yang is a 65 y.o. male who presents today for follow up.   HPI:  Zoloft has helped anxiety however still feeling jittery, nervous. No recurrence of CP. No sob.  Sleeping well. Trazodone 50mg  is helpful for sleep.  No si/hi.  Has been caring for 3 grandchildren which is source of most of anxiety. He doesn't feel anxious when busy such as painting yesterday.    Chronic midline low back pain. This is unchanged. Describes pain in bilateral front thighs which is not new. He also has chronic posterior neck pain.  Numbness in toes. No numbness in legs or arms. No trouble urinating.   HTN-resume bystolic 5mg , compliant with amlodipine 10mg .  MR lumbar 2010. Little change since 2007. slight increase  in size of a shallow disc protrusion at L3-4.These factors combined result and mild  multifactorial stenosis but definite neural compression is not  demonstrated. Previous right hemilaminectomy and discectomy at L4-5,Previous right hemilaminectomy at L5-S1  HISTORY:  Past Medical History:  Diagnosis Date  . Arthritis   . Depression   . GERD (gastroesophageal reflux disease)   . Hypertension   . Polyp of colon    Past Surgical History:  Procedure Laterality Date  . LUMBAR SPINE SURGERY  1990   Dr Quentin Cornwall neurosurgery  . LUMBAR SPINE SURGERY  2001   Dr Astrid Drafts  . NECK SURGERY  1994, 1997, 2001   plate and 2 screws in neck; 2001 cerical fusion Dr Doristine Counter  . OTHER SURGICAL HISTORY    . TONSILLECTOMY  1965   Family History  Problem Relation Age of Onset  . Hypertension Father   . Brain cancer Father 22  . COPD Mother 70  . Heart attack Neg Hx     Allergies: Patient has no known allergies. Current Outpatient Medications on File Prior to Visit  Medication Sig Dispense Refill  . amLODipine (NORVASC) 10 MG tablet Take 1 tablet (10 mg total) by mouth daily. 90 tablet 3  .  cyclobenzaprine (FLEXERIL) 10 MG tablet TAKE 1/2 TABLET BY MOUTH 3 TIMES DAILY AS NEEDED FOR MUSCLE SPASMS. 30 tablet 1  . gabapentin (NEURONTIN) 100 MG capsule Take 1 capsule (100 mg total) by mouth 3 (three) times daily. 90 capsule 3  . nebivolol (BYSTOLIC) 5 MG tablet Take 0.5 tablets (2.5 mg total) by mouth daily. 90 tablet 1  . rosuvastatin (CRESTOR) 10 MG tablet Take 1 tablet (10 mg total) by mouth daily. 90 tablet 3  . traZODone (DESYREL) 50 MG tablet Take 0.5-1 tablets (25-50 mg total) by mouth at bedtime as needed for sleep. 30 tablet 3   No current facility-administered medications on file prior to visit.    Social History   Tobacco Use  . Smoking status: Never Smoker  . Smokeless tobacco: Never Used  Vaping Use  . Vaping Use: Never used  Substance Use Topics  . Alcohol use: Yes  . Drug use: Never    Review of Systems  Constitutional: Negative for chills and fever.  Respiratory: Negative for cough.   Cardiovascular: Negative for chest pain and palpitations.  Gastrointestinal: Negative for nausea and vomiting.  Genitourinary: Negative for difficulty urinating.  Musculoskeletal: Positive for back pain.  Neurological: Positive for numbness (distal toes). Negative for weakness.      Objective:    BP 130/70   Pulse 65   Temp  98 F (36.7 C)   Ht 5\' 7"  (1.702 m)   Wt 172 lb 6.4 oz (78.2 kg)   SpO2 98%   BMI 27.00 kg/m  BP Readings from Last 3 Encounters:  03/12/20 130/70  01/29/20 130/76  07/31/19 128/68   Wt Readings from Last 3 Encounters:  03/12/20 172 lb 6.4 oz (78.2 kg)  01/29/20 174 lb 6.4 oz (79.1 kg)  08/11/19 181 lb (82.1 kg)    Physical Exam Vitals reviewed.  Constitutional:      Appearance: He is well-developed and well-nourished.  Cardiovascular:     Rate and Rhythm: Regular rhythm.     Heart sounds: Normal heart sounds.  Pulmonary:     Effort: Pulmonary effort is normal. No respiratory distress.     Breath sounds: Normal breath sounds. No  wheezing, rhonchi or rales.  Skin:    General: Skin is warm and dry.  Neurological:     Mental Status: He is alert.  Psychiatric:        Mood and Affect: Mood and affect normal.        Speech: Speech normal.        Behavior: Behavior normal.        Assessment & Plan:   Problem List Items Addressed This Visit      Cardiovascular and Mediastinum   Essential hypertension    Stable. Continue bystolic 5mg , compliant with amlodipine 10mg .        Other   Anxiety and depression - Primary    Breakthrough anxiety on zoloft. No recurrence of prior chest pain. We agreed to stop zoloft, trial cymbalta in setting of chronic low back and neck pain. Close follow up.      Relevant Medications   DULoxetine (CYMBALTA) 30 MG capsule   Low back pain    Chronic. Trial of cymbalta.       Relevant Medications   DULoxetine (CYMBALTA) 30 MG capsule       I have discontinued Antony Haste L. [place]'s valACYclovir and sertraline. I am also having him start on DULoxetine. Additionally, I am having him maintain his amLODipine, rosuvastatin, traZODone, gabapentin, nebivolol, and cyclobenzaprine.   Meds ordered this encounter  Medications  . DULoxetine (CYMBALTA) 30 MG capsule    Sig: Take one 30 mg tablet by mouth once a day for the first week. Then increase to two 30 mg tablets ( total 60mg ) by mouth once daily.    Dispense:  60 capsule    Refill:  3    Order Specific Question:   Supervising Provider    Answer:   Crecencio Mc [[id number]]    Return precautions given.   Risks, benefits, and alternatives of the medications and treatment plan prescribed today were discussed, and patient expressed understanding.   Education regarding symptom management and diagnosis given to patient on AVS.  Continue to follow with Burnard Hawthorne, FNP for routine health maintenance.   [name] and I agreed with plan.   Mable Paris, FNP

## 2020-03-12 NOTE — Assessment & Plan Note (Addendum)
Breakthrough anxiety on zoloft. No recurrence of prior chest pain. We agreed to stop zoloft, trial cymbalta in setting of chronic low back and neck pain. Close follow up.

## 2020-03-29 ENCOUNTER — Other Ambulatory Visit: Payer: Self-pay | Admitting: Family

## 2020-03-29 DIAGNOSIS — G4700 Insomnia, unspecified: Secondary | ICD-10-CM

## 2020-04-23 ENCOUNTER — Other Ambulatory Visit: Payer: Self-pay | Admitting: Family

## 2020-04-23 DIAGNOSIS — M5441 Lumbago with sciatica, right side: Secondary | ICD-10-CM

## 2020-04-23 DIAGNOSIS — G8929 Other chronic pain: Secondary | ICD-10-CM

## 2020-04-23 NOTE — Telephone Encounter (Signed)
Last OV 03/12/20 ok to fill.Flexeril

## 2020-05-08 ENCOUNTER — Other Ambulatory Visit: Payer: Self-pay | Admitting: Family

## 2020-05-08 DIAGNOSIS — I10 Essential (primary) hypertension: Secondary | ICD-10-CM

## 2020-05-17 ENCOUNTER — Other Ambulatory Visit: Payer: Self-pay | Admitting: Family

## 2020-05-17 DIAGNOSIS — E785 Hyperlipidemia, unspecified: Secondary | ICD-10-CM

## 2020-06-11 ENCOUNTER — Encounter: Payer: Self-pay | Admitting: Family

## 2020-06-11 ENCOUNTER — Other Ambulatory Visit: Payer: Self-pay

## 2020-06-11 ENCOUNTER — Ambulatory Visit (INDEPENDENT_AMBULATORY_CARE_PROVIDER_SITE_OTHER): Payer: Medicare Other | Admitting: Family

## 2020-06-11 VITALS — BP 104/60 | HR 64 | Temp 97.6°F | Ht 67.0 in | Wt 169.6 lb

## 2020-06-11 DIAGNOSIS — I10 Essential (primary) hypertension: Secondary | ICD-10-CM

## 2020-06-11 DIAGNOSIS — Z1211 Encounter for screening for malignant neoplasm of colon: Secondary | ICD-10-CM | POA: Diagnosis not present

## 2020-06-11 DIAGNOSIS — M545 Low back pain, unspecified: Secondary | ICD-10-CM | POA: Diagnosis not present

## 2020-06-11 DIAGNOSIS — F419 Anxiety disorder, unspecified: Secondary | ICD-10-CM | POA: Diagnosis not present

## 2020-06-11 DIAGNOSIS — F32A Depression, unspecified: Secondary | ICD-10-CM

## 2020-06-11 MED ORDER — HYDROXYZINE HCL 10 MG PO TABS: 10.0000 mg | ORAL_TABLET | Freq: Two times a day (BID) | ORAL | 0 refills | Status: DC | PRN

## 2020-06-11 NOTE — Progress Notes (Signed)
Subjective:    Patient ID: Jeffrey Yang, male    DOB: [birth year], 66 y.o.   MRN: [id number]  CC: Jeffrey Yang is a 64 y.o. male who presents today for follow up.   HPI: HTN- compliant with amlodipine 10mg , bystolic 2.5mg  qd. No dizziness , cp.    anxiety and low back pain- compliant with cymbalta 60mg  . He has breakthrough anxiety and feels restless.  He cannot tell a difference in regards to low back pain. No depression.  Low back pain- compliant with gabapentin 100mg  tid and flexeril prn qhs and back pain is controlled.        HISTORY:  Past Medical History:  Diagnosis Date  . Arthritis   . Depression   . GERD (gastroesophageal reflux disease)   . Hypertension   . Polyp of colon    Past Surgical History:  Procedure Laterality Date  . LUMBAR SPINE SURGERY  1990   Dr Jeffrey Yang neurosurgery  . LUMBAR SPINE SURGERY  2001   Dr Jeffrey Yang  . NECK SURGERY  1994, 1997, 2001   plate and 2 screws in neck; 2001 cerical fusion Dr Jeffrey Yang  . OTHER SURGICAL HISTORY    . TONSILLECTOMY  1965   Family History  Problem Relation Age of Onset  . Hypertension Father   . Brain cancer Father 21  . COPD Mother 9  . Heart attack Neg Hx     Allergies: Patient has no known allergies. Current Outpatient Medications on File Prior to Visit  Medication Sig Dispense Refill  . amLODipine (NORVASC) 10 MG tablet TAKE 1 TABLET BY MOUTH EVERY DAY 90 tablet 3  . cyclobenzaprine (FLEXERIL) 10 MG tablet TAKE 1/2 TABLET BY MOUTH 3 TIMES DAILY AS NEEDED FOR MUSCLE SPASMS. (Patient taking differently: Take 10 mg by mouth at bedtime. TAKE 1/2 TABLET BY MOUTH 3 TIMES DAILY AS NEEDED FOR MUSCLE SPASMS.) 30 tablet 1  . DULoxetine (CYMBALTA) 30 MG capsule Take one 30 mg tablet by mouth once a day for the first week. Then increase to two 30 mg tablets ( total 60mg ) by mouth once daily. 60 capsule 3  . gabapentin (NEURONTIN) 100 MG capsule Take 1 capsule (100 mg total) by mouth 3 (three) times daily. 90 capsule  3  . nebivolol (BYSTOLIC) 5 MG tablet Take 0.5 tablets (2.5 mg total) by mouth daily. 90 tablet 1  . rosuvastatin (CRESTOR) 10 MG tablet TAKE 1 TABLET BY MOUTH EVERY DAY 90 tablet 3  . traZODone (DESYREL) 50 MG tablet TAKE 0.5-1 TABLETS (25-50 MG TOTAL) BY MOUTH AT BEDTIME AS NEEDED FOR SLEEP. 30 tablet 3   No current facility-administered medications on file prior to visit.    Social History   Tobacco Use  . Smoking status: Never Smoker  . Smokeless tobacco: Never Used  Vaping Use  . Vaping Use: Never used  Substance Use Topics  . Alcohol use: Yes  . Drug use: Never    Review of Systems  Constitutional: Negative for chills and fever.  Respiratory: Negative for cough.   Cardiovascular: Negative for chest pain and palpitations.  Gastrointestinal: Negative for nausea and vomiting.  Psychiatric/Behavioral: Negative for sleep disturbance. The patient is nervous/anxious.       Objective:    BP 104/60   Pulse 64   Temp 97.6 F (36.4 C)   Ht 5\' 7"  (1.702 m)   Wt 169 lb 9.6 oz (76.9 kg)   SpO2 97%   BMI 26.56 kg/m  BP Readings from  Last 3 Encounters:  06/11/20 104/60  03/12/20 130/70  01/29/20 130/76   Wt Readings from Last 3 Encounters:  06/11/20 169 lb 9.6 oz (76.9 kg)  03/12/20 172 lb 6.4 oz (78.2 kg)  01/29/20 174 lb 6.4 oz (79.1 kg)    Physical Exam Vitals reviewed.  Constitutional:      Appearance: He is well-developed.  Cardiovascular:     Rate and Rhythm: Regular rhythm.     Heart sounds: Normal heart sounds.  Pulmonary:     Effort: Pulmonary effort is normal. No respiratory distress.     Breath sounds: Normal breath sounds. No wheezing, rhonchi or rales.  Skin:    General: Skin is warm and dry.  Neurological:     Mental Status: He is alert.  Psychiatric:        Speech: Speech normal.        Behavior: Behavior normal.        Assessment & Plan:   Problem List Items Addressed This Visit      Cardiovascular and Mediastinum   Essential  hypertension    Controlled. Lower than has been in the past. Continue amlodipine 10mg , bystolic 2.5mg  for now however will stop bystolic if BP, HR continue to be on low end.         Other   Anxiety and depression - Primary    Uncontrolled anxiety. Start atarax 10mg  bid prn. Continue cymbalta 60mg       Relevant Medications   hydrOXYzine (ATARAX/VISTARIL) 10 MG tablet   Low back pain    Stable. Continue gabapentin 100mg  tid, flexeril 10mg  tid prn.        Other Visit Diagnoses    Screen for colon cancer       Relevant Orders   Ambulatory referral to Gastroenterology       I am having Jeffrey Yang start on hydrOXYzine. I am also having him maintain his gabapentin, nebivolol, DULoxetine, traZODone, cyclobenzaprine, amLODipine, and rosuvastatin.   Meds ordered this encounter  Medications  . hydrOXYzine (ATARAX/VISTARIL) 10 MG tablet    Sig: Take 1 tablet (10 mg total) by mouth 2 (two) times daily as needed for anxiety.    Dispense:  90 tablet    Refill:  0    Order Specific Question:   Supervising Provider    Answer:   Jeffrey Yang [[id number]]    Return precautions given.   Risks, benefits, and alternatives of the medications and treatment plan prescribed today were discussed, and patient expressed understanding.   Education regarding symptom management and diagnosis given to patient on AVS.  Continue to follow with Jeffrey Hawthorne, Jeffrey Yang for routine health maintenance.   Jeffrey Yang and I agreed with plan.   Jeffrey Paris, Jeffrey Yang

## 2020-06-11 NOTE — Patient Instructions (Signed)
May trial atarax twice per day for anxiety  Colonoscopy ordered  Let us know if you dont hear back within a week in regards to an appointment being scheduled.

## 2020-06-11 NOTE — Assessment & Plan Note (Signed)
Uncontrolled anxiety. Start atarax 10mg  bid prn. Continue cymbalta 60mg 

## 2020-06-11 NOTE — Assessment & Plan Note (Signed)
Stable. Continue gabapentin 100mg  tid, flexeril 10mg  tid prn.

## 2020-06-11 NOTE — Assessment & Plan Note (Signed)
Controlled. Lower than has been in the past. Continue amlodipine 10mg , bystolic 2.5mg  for now however will stop bystolic if BP, HR continue to be on low end.

## 2020-07-02 ENCOUNTER — Other Ambulatory Visit: Payer: Self-pay | Admitting: Family

## 2020-07-02 DIAGNOSIS — M5442 Lumbago with sciatica, left side: Secondary | ICD-10-CM

## 2020-07-02 DIAGNOSIS — G8929 Other chronic pain: Secondary | ICD-10-CM

## 2020-07-02 DIAGNOSIS — F419 Anxiety disorder, unspecified: Secondary | ICD-10-CM

## 2020-07-24 ENCOUNTER — Other Ambulatory Visit: Payer: Self-pay

## 2020-07-24 ENCOUNTER — Encounter: Payer: Self-pay | Admitting: Family

## 2020-07-24 ENCOUNTER — Ambulatory Visit (INDEPENDENT_AMBULATORY_CARE_PROVIDER_SITE_OTHER): Payer: Medicare Other | Admitting: Family

## 2020-07-24 VITALS — BP 118/72 | HR 87 | Temp 98.0°F | Ht 67.0 in | Wt 165.0 lb

## 2020-07-24 DIAGNOSIS — F419 Anxiety disorder, unspecified: Secondary | ICD-10-CM | POA: Diagnosis not present

## 2020-07-24 DIAGNOSIS — F32A Depression, unspecified: Secondary | ICD-10-CM

## 2020-07-24 DIAGNOSIS — M545 Low back pain, unspecified: Secondary | ICD-10-CM | POA: Diagnosis not present

## 2020-07-24 DIAGNOSIS — I10 Essential (primary) hypertension: Secondary | ICD-10-CM

## 2020-07-24 LAB — TSH: TSH: 1.72 u[IU]/mL (ref 0.35–4.50)

## 2020-07-24 MED ORDER — DULOXETINE HCL 30 MG PO CPEP: 60.0000 mg | ORAL_CAPSULE | Freq: Every day | ORAL | 3 refills | Status: DC

## 2020-07-24 MED ORDER — NEBIVOLOL HCL 5 MG PO TABS: 2.5000 mg | ORAL_TABLET | Freq: Every day | ORAL | 1 refills | Status: DC

## 2020-07-24 MED ORDER — BUSPIRONE HCL 7.5 MG PO TABS: 7.5000 mg | ORAL_TABLET | Freq: Two times a day (BID) | ORAL | 2 refills | Status: AC

## 2020-07-24 NOTE — Patient Instructions (Signed)
Start buspar  Stop atarax  Let me know how you are doing

## 2020-07-24 NOTE — Progress Notes (Signed)
Subjective:    Patient ID: Jeffrey Yang, male    DOB: [birth year], 65 y.o.   MRN: [id number]  CC: Jeffrey Yang is a 65 y.o. male who presents today for follow up.   HPI: Anxiety- compliant with cymbalta 60mg  Atarax made him to sleepy and he would like medication to take during the day. Keeping 56 , 75 and 12 year old grandchildren most of school year and summer.   Referred for colonoscopy and received call however not sure when he can do this due to caring for grandchildren. 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   He had been on klonopin in the past  Has stopped drinking during the week, he drinks on Friday and Saturday, such as whiskey 4-5 drinks per night.       No si/hi  HISTORY:  Past Medical History:  Diagnosis Date   Arthritis    Depression    GERD (gastroesophageal reflux disease)    Hypertension    Polyp of colon    Past Surgical History:  Procedure Laterality Date   LUMBAR SPINE SURGERY  1990   Dr Quentin Cornwall neurosurgery   LUMBAR SPINE SURGERY  2001   Dr Astrid Drafts   NECK SURGERY  1994, 1997, 2001   plate and 2 screws in neck; 2001 cerical fusion Dr Doristine Counter   OTHER SURGICAL HISTORY     TONSILLECTOMY  1965   Family History  Problem Relation Age of Onset   Hypertension Father    Brain cancer Father 9   COPD Mother 37   Heart attack Neg Hx     Allergies: Patient has no known allergies. Current Outpatient Medications on File Prior to Visit  Medication Sig Dispense Refill   amLODipine (NORVASC) 10 MG tablet TAKE 1 TABLET BY MOUTH EVERY DAY 90 tablet 3   cyclobenzaprine (FLEXERIL) 10 MG tablet TAKE 1/2 TABLET BY MOUTH 3 TIMES DAILY AS NEEDED FOR MUSCLE SPASMS. 30 tablet 1   gabapentin (NEURONTIN) 100 MG capsule TAKE 1 CAPSULE BY MOUTH THREE TIMES A DAY 90 capsule 3   rosuvastatin (CRESTOR) 10 MG tablet TAKE 1 TABLET BY MOUTH EVERY DAY 90 tablet 3    traZODone (DESYREL) 50 MG tablet TAKE 0.5-1 TABLETS (25-50 MG TOTAL) BY MOUTH AT BEDTIME AS NEEDED FOR SLEEP. 30 tablet 3   No current facility-administered medications on file prior to visit.    Social History   Tobacco Use   Smoking status: Never   Smokeless tobacco: Never  Vaping Use   Vaping Use: Never used  Substance Use Topics   Alcohol use: Yes   Drug use: Never    Review of Systems  Constitutional:  Negative for chills and fever.  Respiratory:  Negative for cough.   Cardiovascular:  Negative for chest pain and palpitations.  Gastrointestinal:  Negative for nausea and vomiting.  Psychiatric/Behavioral:  Negative for sleep disturbance and suicidal ideas. The patient is nervous/anxious.      Objective:    BP 118/72 (BP Location: Left Arm, Patient Position: Sitting, Cuff Size: Large)   Pulse 87   Temp 98 F (36.7 C) (Oral)   Ht 5\' 7"  (1.702 m)   Wt 165 lb (74.8 kg)   SpO2 97%   BMI 25.84 kg/m  BP Readings from Last 3 Encounters:  07/24/20 118/72  06/11/20 104/60  03/12/20 130/70   Wt Readings from Last  3 Encounters:  07/24/20 165 lb (74.8 kg)  06/11/20 169 lb 9.6 oz (76.9 kg)  03/12/20 172 lb 6.4 oz (78.2 kg)    Physical Exam Vitals reviewed.  Constitutional:      Appearance: He is well-developed.  Cardiovascular:     Rate and Rhythm: Regular rhythm.     Heart sounds: Normal heart sounds.  Pulmonary:     Effort: Pulmonary effort is normal. No respiratory distress.     Breath sounds: Normal breath sounds. No wheezing, rhonchi or rales.  Skin:    General: Skin is warm and dry.  Neurological:     Mental Status: He is alert.  Psychiatric:        Speech: Speech normal.        Behavior: Behavior normal.       Assessment & Plan:   Problem List Items Addressed This Visit       Cardiovascular and Mediastinum   Essential hypertension   Relevant Medications   nebivolol (BYSTOLIC) 5 MG tablet     Other   Anxiety and depression - Primary     Anxiety uncontrolled. 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 Discussed how to set boundaries with his children. Stop atarax. Start buspar 7.5mg  bid. Continue cymbalta 60mg  qd.        Relevant Medications   busPIRone (BUSPAR) 7.5 MG tablet   DULoxetine (CYMBALTA) 30 MG capsule   Other Relevant Orders   TSH (Completed)   Low back pain   Relevant Medications   DULoxetine (CYMBALTA) 30 MG capsule   Of note: No risk factors for which would require pneumococcal 23. Will wait until 65 years old  I have discontinued Padraic L. Cory's hydrOXYzine. I have also changed his DULoxetine. Additionally, I am having him start on busPIRone. Lastly, I am having him maintain his traZODone, amLODipine, rosuvastatin, cyclobenzaprine, gabapentin, and nebivolol.   Meds ordered this encounter  Medications   busPIRone (BUSPAR) 7.5 MG tablet    Sig: Take 1 tablet (7.5 mg total) by mouth 2 (two) times daily.    Dispense:  60 tablet    Refill:  2    Order Specific Question:   Supervising Provider    Answer:   Deborra Medina L [[id number]]   DULoxetine (CYMBALTA) 30 MG capsule    Sig: Take 2 capsules (60 mg total) by mouth daily.    Dispense:  120 capsule    Refill:  3    Order Specific Question:   Supervising Provider    Answer:   Deborra Medina L [[id number]]   nebivolol (BYSTOLIC) 5 MG tablet    Sig: Take 0.5 tablets (2.5 mg total) by mouth daily.    Dispense:  90 tablet    Refill:  1    Order Specific Question:   Supervising Provider    Answer:   Crecencio Mc [[id number]]    Return precautions given.   Risks, benefits, and alternatives of the medications and treatment plan prescribed today were discussed, and patient expressed understanding.   Education regarding symptom management and diagnosis given to patient on AVS.  Continue to follow with Burnard Hawthorne, FNP for routine health maintenance.   [name] and I agreed with plan.   Mable Paris,  FNP

## 2020-07-30 NOTE — Assessment & Plan Note (Signed)
Anxiety uncontrolled. 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 Discussed how to set boundaries with his children. Stop atarax. Start buspar 7.5mg  bid. Continue cymbalta 60mg  qd.

## 2020-08-01 ENCOUNTER — Ambulatory Visit: Payer: Medicare Other | Admitting: Adult Health

## 2020-08-05 ENCOUNTER — Ambulatory Visit (INDEPENDENT_AMBULATORY_CARE_PROVIDER_SITE_OTHER): Payer: Medicare Other | Admitting: Family

## 2020-08-05 ENCOUNTER — Encounter: Payer: Self-pay | Admitting: Family

## 2020-08-05 ENCOUNTER — Ambulatory Visit (INDEPENDENT_AMBULATORY_CARE_PROVIDER_SITE_OTHER): Payer: Medicare Other

## 2020-08-05 ENCOUNTER — Other Ambulatory Visit: Payer: Self-pay

## 2020-08-05 VITALS — BP 130/68 | HR 56 | Temp 97.7°F | Ht 67.0 in | Wt 169.5 lb

## 2020-08-05 DIAGNOSIS — M11261 Other chondrocalcinosis, right knee: Secondary | ICD-10-CM | POA: Diagnosis not present

## 2020-08-05 DIAGNOSIS — M25561 Pain in right knee: Secondary | ICD-10-CM

## 2020-08-05 DIAGNOSIS — M1711 Unilateral primary osteoarthritis, right knee: Secondary | ICD-10-CM | POA: Diagnosis not present

## 2020-08-05 DIAGNOSIS — M25461 Effusion, right knee: Secondary | ICD-10-CM | POA: Diagnosis not present

## 2020-08-05 MED ORDER — MELOXICAM 15 MG PO TABS: 15.0000 mg | ORAL_TABLET | Freq: Every day | ORAL | 1 refills | Status: DC | PRN

## 2020-08-05 NOTE — Progress Notes (Signed)
Subjective:    Patient ID: ROSWELL NDIAYE, male    DOB: 01/16/1956, 65 y.o.   MRN: [id number]  CC: HARCE VOLDEN is a 65 y.o. male who presents today for an acute visit.    HPI: Right knee pain, x 6 days , improved Frontal and posterior knee pain Swelling above the front of knee which has improved.  Pain when being still worse with flexion or getting on knees. Knee feels like it may give way.  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   Iced , advil, and elevation with relief.  NO fever, increased heat, redness, rash.   No h/o gib.   No h/o gout.       HISTORY:  Past Medical History:  Diagnosis Date   Arthritis    Depression    GERD (gastroesophageal reflux disease)    Hypertension    Polyp of colon    Past Surgical History:  Procedure Laterality Date   LUMBAR SPINE SURGERY  1990   Dr Quentin Cornwall neurosurgery   LUMBAR SPINE SURGERY  2001   Dr Astrid Drafts   NECK SURGERY  1994, 1997, 2001   plate and 2 screws in neck; 2001 cerical fusion Dr Doristine Counter   OTHER SURGICAL HISTORY     right knee Right 1999   meniscal repair, arthoscopic   TONSILLECTOMY  1965   Family History  Problem Relation Age of Onset   Hypertension Father    Brain cancer Father 70   COPD Mother 61   Heart attack Neg Hx     Allergies: Patient has no known allergies. Current Outpatient Medications on File Prior to Visit  Medication Sig Dispense Refill   amLODipine (NORVASC) 10 MG tablet TAKE 1 TABLET BY MOUTH EVERY DAY 90 tablet 3   busPIRone (BUSPAR) 7.5 MG tablet Take 1 tablet (7.5 mg total) by mouth 2 (two) times daily. 60 tablet 2   cyclobenzaprine (FLEXERIL) 10 MG tablet TAKE 1/2 TABLET BY MOUTH 3 TIMES DAILY AS NEEDED FOR MUSCLE SPASMS. 30 tablet 1   DULoxetine (CYMBALTA) 30 MG capsule Take 2 capsules (60 mg total) by mouth daily. 120 capsule 3   gabapentin (NEURONTIN) 100 MG capsule TAKE 1 CAPSULE BY MOUTH THREE TIMES A DAY 90 capsule 3   nebivolol (BYSTOLIC) 5 MG tablet  Take 0.5 tablets (2.5 mg total) by mouth daily. 90 tablet 1   rosuvastatin (CRESTOR) 10 MG tablet TAKE 1 TABLET BY MOUTH EVERY DAY 90 tablet 3   traZODone (DESYREL) 50 MG tablet TAKE 0.5-1 TABLETS (25-50 MG TOTAL) BY MOUTH AT BEDTIME AS NEEDED FOR SLEEP. 30 tablet 3   No current facility-administered medications on file prior to visit.    Social History   Tobacco Use   Smoking status: Never   Smokeless tobacco: Never  Vaping Use   Vaping Use: Never used  Substance Use Topics   Alcohol use: Yes   Drug use: Never    Review of Systems  Constitutional:  Negative for chills and fever.  Respiratory:  Negative for cough.   Cardiovascular:  Negative for chest pain and palpitations.  Gastrointestinal:  Negative for nausea and vomiting.  Musculoskeletal:  Positive for arthralgias and joint swelling.  Skin:  Negative for pallor, rash and wound.     Objective:    BP 130/68 (BP Location: Left Arm, Patient Position: Sitting, Cuff Size: Large)   Pulse (!) 56   Temp 97.7 F (36.5 C) (Oral)   Ht 5\' 7"  (  1.702 m)   Wt 169 lb 8 oz (76.9 kg)   SpO2 99%   BMI 26.55 kg/m    Physical Exam Vitals reviewed.  Constitutional:      Appearance: He is well-developed.  Cardiovascular:     Rate and Rhythm: Regular rhythm.     Heart sounds: Normal heart sounds.  Pulmonary:     Effort: Pulmonary effort is normal. No respiratory distress.     Breath sounds: Normal breath sounds. No wheezing, rhonchi or rales.  Musculoskeletal:     Right knee: Swelling (supraarticular) present. No ecchymosis, bony tenderness or crepitus. Normal range of motion. Tenderness present over the medial joint line.     Left knee: No swelling, deformity or ecchymosis. Normal range of motion. No tenderness.     Right lower leg: No swelling.     Left lower leg: No swelling.       Legs:     Comments: Subtle swelling medial side over right knee. Non fluctuant  Bilateral knees are symmetric. No effusion appreciated. No  increase in warmth or erythema. Crepitus felt with flexion of bilateral knees.  Right knee:  Able to extend to -5 to 10 degrees and flex to 110 degrees. No catching with McMurray maneuver. No patellar apprehension. Negative anterior drawer and lachman's- no laxity appreciated.  No calf tenderness of lower leg edema bilaterally.    Skin:    General: Skin is warm and dry.  Neurological:     Mental Status: He is alert.  Psychiatric:        Speech: Speech normal.        Behavior: Behavior normal.       Assessment & Plan:   Problem List Items Addressed This Visit       Other   Right knee pain - Primary    Acute. Swelling improved. Full ROM today. Concern for meniscal etiology. Agreed conservative therapy with rest, compression, icing regimen and mobic. If no improvement, advised to let me know as would recommend consult with orthopedics.        Relevant Medications   meloxicam (MOBIC) 15 MG tablet   Other Relevant Orders   DG Knee Complete 4 Views Right      I am having [name] start on meloxicam. I am also having him maintain his traZODone, amLODipine, rosuvastatin, cyclobenzaprine, gabapentin, busPIRone, DULoxetine, and nebivolol.   Meds ordered this encounter  Medications   meloxicam (MOBIC) 15 MG tablet    Sig: Take 1 tablet (15 mg total) by mouth daily as needed for pain.    Dispense:  30 tablet    Refill:  1    Order Specific Question:   Supervising Provider    Answer:   Crecencio Mc [[id number]]    Return precautions given.   Risks, benefits, and alternatives of the medications and treatment plan prescribed today were discussed, and patient expressed understanding.   Education regarding symptom management and diagnosis given to patient on AVS.  Continue to follow with Burnard Hawthorne, FNP for routine health maintenance.   [name] and I agreed with plan.   Mable Paris, FNP

## 2020-08-05 NOTE — Assessment & Plan Note (Addendum)
Acute. Swelling improved. Full ROM today. Concern for meniscal etiology. Agreed conservative therapy with rest, compression, icing regimen and mobic. If no improvement, advised to let me know as would recommend consult with orthopedics.  Attendum: After reviewing xray, advised referral to orthopedics for further evaluation. Referral placed.

## 2020-08-05 NOTE — Patient Instructions (Addendum)
Ace wrap Rest  Ice 20 minutes 3 times daily  Stop advil  Start mobic as needed for the next few days. Blood pressure can elevate on mobic so please monitor your blood pressure    A couple of points in regards to meloxicam ( Mobic) -  This medication is not intended for daily , long term use. It is a potent anti inflammatory ( NSAID), and my intention is for you take as needed for moderate to severe pain. If you find yourself using daily, please let me know.   Please takes Mobic ( meloxicam) with FOOD since it is an anti-inflammatory as it can cause a GI bleed or ulcer. If you have a history of GI bleed or ulcer, please do NOT take.  Do no take over the counter aleve, motrin, advil, goody's powder for pain as they are also NSAIDs, and they are  in the same class as Mobic  Lastly, we will need to monitor kidney function while on Mobic, and if we were to see any decline in kidney function in the future, we would have to discontinue this medication.

## 2020-08-06 ENCOUNTER — Other Ambulatory Visit: Payer: Self-pay | Admitting: Family

## 2020-08-06 DIAGNOSIS — G4700 Insomnia, unspecified: Secondary | ICD-10-CM

## 2020-08-07 ENCOUNTER — Other Ambulatory Visit: Payer: Self-pay | Admitting: Family

## 2020-08-07 DIAGNOSIS — M25561 Pain in right knee: Secondary | ICD-10-CM

## 2020-08-12 ENCOUNTER — Ambulatory Visit: Payer: Medicare Other

## 2020-08-13 ENCOUNTER — Ambulatory Visit: Payer: Medicare Other

## 2020-08-17 ENCOUNTER — Other Ambulatory Visit: Payer: Self-pay | Admitting: Family

## 2020-08-17 DIAGNOSIS — F419 Anxiety disorder, unspecified: Secondary | ICD-10-CM

## 2020-08-21 ENCOUNTER — Telehealth: Payer: Self-pay

## 2020-08-21 NOTE — Telephone Encounter (Signed)
Pt called and states that he has left several messages for Dr Clydell Hakim office and they have not responded. Please advise

## 2020-09-12 ENCOUNTER — Telehealth: Payer: Self-pay | Admitting: Family

## 2020-09-12 DIAGNOSIS — M1711 Unilateral primary osteoarthritis, right knee: Secondary | ICD-10-CM | POA: Diagnosis not present

## 2020-09-12 NOTE — Telephone Encounter (Signed)
Left message for patient to call back and schedule Medicare Annual Wellness Visit (AWV) in office.   If not able to come in office, please offer to do virtually or by telephone.   Last AWV:08/11/2019  Please schedule at anytime with Nurse Health Advisor.

## 2020-09-28 ENCOUNTER — Other Ambulatory Visit: Payer: Self-pay | Admitting: Family

## 2020-09-28 DIAGNOSIS — G4700 Insomnia, unspecified: Secondary | ICD-10-CM

## 2020-10-02 ENCOUNTER — Other Ambulatory Visit: Payer: Self-pay | Admitting: Family

## 2020-10-02 DIAGNOSIS — M25561 Pain in right knee: Secondary | ICD-10-CM

## 2020-10-25 ENCOUNTER — Encounter: Payer: Self-pay | Admitting: Family

## 2020-10-25 ENCOUNTER — Other Ambulatory Visit: Payer: Self-pay

## 2020-10-25 ENCOUNTER — Ambulatory Visit (INDEPENDENT_AMBULATORY_CARE_PROVIDER_SITE_OTHER): Payer: Medicare Other | Admitting: Family

## 2020-10-25 VITALS — BP 118/60 | HR 51 | Temp 97.9°F | Ht 67.0 in | Wt 168.6 lb

## 2020-10-25 DIAGNOSIS — G4700 Insomnia, unspecified: Secondary | ICD-10-CM

## 2020-10-25 DIAGNOSIS — Z23 Encounter for immunization: Secondary | ICD-10-CM

## 2020-10-25 DIAGNOSIS — F32A Depression, unspecified: Secondary | ICD-10-CM | POA: Diagnosis not present

## 2020-10-25 DIAGNOSIS — F419 Anxiety disorder, unspecified: Secondary | ICD-10-CM | POA: Diagnosis not present

## 2020-10-25 DIAGNOSIS — I10 Essential (primary) hypertension: Secondary | ICD-10-CM

## 2020-10-25 DIAGNOSIS — M545 Low back pain, unspecified: Secondary | ICD-10-CM

## 2020-10-25 DIAGNOSIS — E785 Hyperlipidemia, unspecified: Secondary | ICD-10-CM

## 2020-10-25 DIAGNOSIS — Z1211 Encounter for screening for malignant neoplasm of colon: Secondary | ICD-10-CM | POA: Diagnosis not present

## 2020-10-25 NOTE — Assessment & Plan Note (Signed)
Chronic, stable.  Continue Cymbalta 60 mg

## 2020-10-25 NOTE — Assessment & Plan Note (Signed)
Chronic, stable.  Continue Bystolic 2.'5mg'$  , amlodipine 10 mg

## 2020-10-25 NOTE — Patient Instructions (Addendum)
Referral for colonoscopy Let us know if you dont hear back within a week in regards to an appointment being scheduled.   

## 2020-10-25 NOTE — Assessment & Plan Note (Signed)
Chronic, stable.  Continue trazodone 50 mg

## 2020-10-25 NOTE — Progress Notes (Signed)
Subjective:    Patient ID: Jeffrey Yang, male    DOB: Nov 16, 1955, 65 y.o.   MRN: [id number]  CC: Jeffrey Yang is a 65 y.o. male who presents today for follow up.   HPI: Right knee pain has greatly improved. No longer bothersome for him.  Consult with Dr. Marry Guan, orthopedics 09/12/2020 for right knee pain. He hasnt used mobic '15mg'$  prn.  He declined PT, steroid injection.   GAD-he is compliant with Cymbalta 60 mg. He feels well on this regimen.   Chronic low back pain- well controlled. he has not required gabapentin 100 mg TID, Flexeril 10 mg as needed.  Hypertension-compliant with Bystolic 2.'5mg'$  , amlodipine 10 mg. No cp, sob  Hyperlipidemia-compliant with Crestor '10mg'$   Insomnia-he is compliant with trazodone 50 mg nightly  Last colonoscopy 10 years ago. Had a polyp.     HISTORY:  Past Medical History:  Diagnosis Date   Arthritis    Depression    GERD (gastroesophageal reflux disease)    Hypertension    Polyp of colon    Past Surgical History:  Procedure Laterality Date   LUMBAR SPINE SURGERY  1990   Dr Quentin Cornwall neurosurgery   LUMBAR SPINE SURGERY  2001   Dr Astrid Drafts   NECK SURGERY  1994, 1997, 2001   plate and 2 screws in neck; 2001 cerical fusion Dr Doristine Counter   OTHER SURGICAL HISTORY     right knee Right 1999   meniscal repair, arthoscopic   TONSILLECTOMY  1965   Family History  Problem Relation Age of Onset   Hypertension Father    Brain cancer Father 24   COPD Mother 54   Heart attack Neg Hx     Allergies: Codeine Current Outpatient Medications on File Prior to Visit  Medication Sig Dispense Refill   amLODipine (NORVASC) 10 MG tablet TAKE 1 TABLET BY MOUTH EVERY DAY 90 tablet 3   cyclobenzaprine (FLEXERIL) 10 MG tablet TAKE 1/2 TABLET BY MOUTH 3 TIMES DAILY AS NEEDED FOR MUSCLE SPASMS. 30 tablet 1   DULoxetine (CYMBALTA) 30 MG capsule Take 2 capsules (60 mg total) by mouth daily. 120 capsule 3   gabapentin (NEURONTIN) 100 MG capsule TAKE 1 CAPSULE BY  MOUTH THREE TIMES A DAY 90 capsule 3   meloxicam (MOBIC) 15 MG tablet TAKE 1 TABLET BY MOUTH EVERY DAY AS NEEDED FOR PAIN 30 tablet 1   nebivolol (BYSTOLIC) 5 MG tablet Take 0.5 tablets (2.5 mg total) by mouth daily. 90 tablet 1   rosuvastatin (CRESTOR) 10 MG tablet TAKE 1 TABLET BY MOUTH EVERY DAY 90 tablet 3   traZODone (DESYREL) 50 MG tablet TAKE 1/2 TO 1 TABLET BY MOUTH AT BEDTIME AS NEEDED FOR SLEEP 90 tablet 1   No current facility-administered medications on file prior to visit.    Social History   Tobacco Use   Smoking status: Never   Smokeless tobacco: Never  Vaping Use   Vaping Use: Never used  Substance Use Topics   Alcohol use: Yes   Drug use: Never    Review of Systems  Constitutional:  Negative for chills and fever.  Respiratory:  Negative for cough.   Cardiovascular:  Negative for chest pain and palpitations.  Gastrointestinal:  Negative for nausea and vomiting.     Objective:    BP 118/60 (BP Location: Left Arm, Patient Position: Sitting, Cuff Size: Normal)   Pulse (!) 51   Temp 97.9 F (36.6 C) (Oral)   Ht '5\' 7"'$  (1.702 m)  Wt 168 lb 9.6 oz (76.5 kg)   SpO2 97%   BMI 26.41 kg/m  BP Readings from Last 3 Encounters:  10/25/20 118/60  08/05/20 130/68  07/24/20 118/72   Wt Readings from Last 3 Encounters:  10/25/20 168 lb 9.6 oz (76.5 kg)  08/05/20 169 lb 8 oz (76.9 kg)  07/24/20 165 lb (74.8 kg)    Physical Exam Vitals reviewed.  Constitutional:      Appearance: He is well-developed.  Cardiovascular:     Rate and Rhythm: Regular rhythm.     Heart sounds: Normal heart sounds.  Pulmonary:     Effort: Pulmonary effort is normal. No respiratory distress.     Breath sounds: Normal breath sounds. No wheezing, rhonchi or rales.  Skin:    General: Skin is warm and dry.  Neurological:     Mental Status: He is alert.  Psychiatric:        Speech: Speech normal.        Behavior: Behavior normal.       Assessment & Plan:   Problem List Items  Addressed This Visit       Cardiovascular and Mediastinum   Essential hypertension    Chronic, stable.  Continue Bystolic 2.'5mg'$  , amlodipine 10 mg        Other   Anxiety and depression    Chronic, stable.  Continue Cymbalta 60 mg      HLD (hyperlipidemia)    Chronic, stable.  Continue Crestor 10 mg      Insomnia    Chronic, stable.  Continue trazodone 50 mg      Low back pain    Very well controlled at this time.  He has not required gabapentin or Flexeril.  Continue Cymbalta '60mg'$ .      Other Visit Diagnoses     Screen for colon cancer    -  Primary   Relevant Orders   Ambulatory referral to Gastroenterology        I am having Jeffrey Yang maintain his amLODipine, rosuvastatin, cyclobenzaprine, gabapentin, DULoxetine, nebivolol, traZODone, and meloxicam.   No orders of the defined types were placed in this encounter.   Return precautions given.   Risks, benefits, and alternatives of the medications and treatment plan prescribed today were discussed, and patient expressed understanding.   Education regarding symptom management and diagnosis given to patient on AVS.  Continue to follow with Burnard Hawthorne, FNP for routine health maintenance.   Jeffrey Yang and I agreed with plan.   Mable Paris, FNP

## 2020-10-25 NOTE — Assessment & Plan Note (Signed)
Very well controlled at this time.  He has not required gabapentin or Flexeril.  Continue Cymbalta '60mg'$ .

## 2020-10-25 NOTE — Assessment & Plan Note (Signed)
Chronic, stable.  Continue Crestor 10 mg 

## 2020-10-28 ENCOUNTER — Other Ambulatory Visit (INDEPENDENT_AMBULATORY_CARE_PROVIDER_SITE_OTHER): Payer: Self-pay

## 2020-10-28 DIAGNOSIS — Z1211 Encounter for screening for malignant neoplasm of colon: Secondary | ICD-10-CM

## 2020-10-28 MED ORDER — NA SULFATE-K SULFATE-MG SULF 17.5-3.13-1.6 GM/177ML PO SOLN: 1.0000 | Freq: Once | ORAL | 0 refills | Status: AC

## 2020-10-28 NOTE — Progress Notes (Signed)
Gastroenterology Pre-Procedure Review  Request Date: 12/13/20 Requesting Physician: Dr. Vicente Males  PATIENT REVIEW QUESTIONS: The patient responded to the following health history questions as indicated:    1. Are you having any GI issues? no 2. Do you have a personal history of Polyps? yes (last colonoscopy was 20 years ago.) 3. Do you have a family history of Colon Cancer or Polyps? yes (Mother colon polyps; brother colon polyps) 4. Diabetes Mellitus? no 5. Joint replacements in the past 12 months?no 6. Major health problems in the past 3 months?no 7. Any artificial heart valves, MVP, or defibrillator?no    MEDICATIONS & ALLERGIES:    Patient reports the following regarding taking any anticoagulation/antiplatelet therapy:   Plavix, Coumadin, Eliquis, Xarelto, Lovenox, Pradaxa, Brilinta, or Effient? no Aspirin? no  Patient confirms/reports the following medications:  Current Outpatient Medications  Medication Sig Dispense Refill   amLODipine (NORVASC) 10 MG tablet TAKE 1 TABLET BY MOUTH EVERY DAY 90 tablet 3   cyclobenzaprine (FLEXERIL) 10 MG tablet TAKE 1/2 TABLET BY MOUTH 3 TIMES DAILY AS NEEDED FOR MUSCLE SPASMS. 30 tablet 1   DULoxetine (CYMBALTA) 30 MG capsule Take 2 capsules (60 mg total) by mouth daily. 120 capsule 3   gabapentin (NEURONTIN) 100 MG capsule TAKE 1 CAPSULE BY MOUTH THREE TIMES A DAY 90 capsule 3   meloxicam (MOBIC) 15 MG tablet TAKE 1 TABLET BY MOUTH EVERY DAY AS NEEDED FOR PAIN 30 tablet 1   nebivolol (BYSTOLIC) 5 MG tablet Take 0.5 tablets (2.5 mg total) by mouth daily. 90 tablet 1   rosuvastatin (CRESTOR) 10 MG tablet TAKE 1 TABLET BY MOUTH EVERY DAY 90 tablet 3   traZODone (DESYREL) 50 MG tablet TAKE 1/2 TO 1 TABLET BY MOUTH AT BEDTIME AS NEEDED FOR SLEEP 90 tablet 1   No current facility-administered medications for this visit.    Patient confirms/reports the following allergies:  Allergies  Allergen Reactions   Codeine Itching    SCRATCHY THROAT AND "FEEL  WEIRD"    No orders of the defined types were placed in this encounter.   AUTHORIZATION INFORMATION Primary Insurance: 1D#: Group #:  Secondary Insurance: 1D#: Group #:  SCHEDULE INFORMATION: Date: 12/13/20 Time: Location: [facility]

## 2020-12-12 ENCOUNTER — Encounter: Payer: Self-pay | Admitting: Gastroenterology

## 2020-12-13 ENCOUNTER — Ambulatory Visit: Payer: Medicare Other | Admitting: Anesthesiology

## 2020-12-13 ENCOUNTER — Ambulatory Visit
Admission: RE | Admit: 2020-12-13 | Discharge: 2020-12-13 | Disposition: A | Discharge status: Home / Self Care | Payer: Medicare Other | Source: Ambulatory Visit | Attending: Gastroenterology | Admitting: Gastroenterology

## 2020-12-13 ENCOUNTER — Encounter: Payer: Self-pay | Admitting: Gastroenterology

## 2020-12-13 ENCOUNTER — Encounter
Admission: RE | Disposition: A | Discharge status: Home / Self Care | Payer: Self-pay | Source: Ambulatory Visit | Attending: Gastroenterology

## 2020-12-13 DIAGNOSIS — Z885 Allergy status to narcotic agent status: Secondary | ICD-10-CM | POA: Diagnosis not present

## 2020-12-13 DIAGNOSIS — D122 Benign neoplasm of ascending colon: Secondary | ICD-10-CM | POA: Diagnosis not present

## 2020-12-13 DIAGNOSIS — Z79899 Other long term (current) drug therapy: Secondary | ICD-10-CM | POA: Diagnosis not present

## 2020-12-13 DIAGNOSIS — Z1211 Encounter for screening for malignant neoplasm of colon: Secondary | ICD-10-CM | POA: Insufficient documentation

## 2020-12-13 DIAGNOSIS — K635 Polyp of colon: Secondary | ICD-10-CM | POA: Diagnosis not present

## 2020-12-13 HISTORY — PX: COLONOSCOPY WITH PROPOFOL: SHX5780

## 2020-12-13 SURGERY — COLONOSCOPY WITH PROPOFOL
Anesthesia: General

## 2020-12-13 MED ORDER — PROPOFOL 500 MG/50ML IV EMUL
INTRAVENOUS | Status: DC | PRN
  Administered 2020-12-13: 50 ug/kg/min via INTRAVENOUS

## 2020-12-13 MED ORDER — PROPOFOL 10 MG/ML IV BOLUS
INTRAVENOUS | Status: AC
  Filled 2020-12-13: qty 20

## 2020-12-13 MED ORDER — FENTANYL CITRATE (PF) 100 MCG/2ML IJ SOLN
INTRAMUSCULAR | Status: DC | PRN
  Administered 2020-12-13 (×2): 50 ug via INTRAVENOUS

## 2020-12-13 MED ORDER — SODIUM CHLORIDE 0.9 % IV SOLN
INTRAVENOUS | Status: DC
  Administered 2020-12-13: 1000 mL via INTRAVENOUS

## 2020-12-13 MED ORDER — FENTANYL CITRATE (PF) 100 MCG/2ML IJ SOLN
INTRAMUSCULAR | Status: AC
  Filled 2020-12-13: qty 2

## 2020-12-13 MED ORDER — DEXMEDETOMIDINE HCL IN NACL 200 MCG/50ML IV SOLN
INTRAVENOUS | Status: AC
  Filled 2020-12-13: qty 50

## 2020-12-13 MED ORDER — LIDOCAINE HCL (CARDIAC) PF 100 MG/5ML IV SOSY
PREFILLED_SYRINGE | INTRAVENOUS | Status: DC | PRN
  Administered 2020-12-13: 50 mg via INTRAVENOUS

## 2020-12-13 MED ORDER — PROPOFOL 10 MG/ML IV BOLUS
INTRAVENOUS | Status: DC | PRN
  Administered 2020-12-13: 50 mg via INTRAVENOUS

## 2020-12-13 MED ORDER — MIDAZOLAM HCL 2 MG/2ML IJ SOLN
INTRAMUSCULAR | Status: DC | PRN
  Administered 2020-12-13: 2 mg via INTRAVENOUS

## 2020-12-13 MED ORDER — MIDAZOLAM HCL 2 MG/2ML IJ SOLN
INTRAMUSCULAR | Status: AC
  Filled 2020-12-13: qty 2

## 2020-12-13 MED ORDER — LIDOCAINE HCL (PF) 2 % IJ SOLN
INTRAMUSCULAR | Status: AC
  Filled 2020-12-13: qty 15

## 2020-12-13 NOTE — Op Note (Signed)
[facility] Gastroenterology Patient Name: Jeffrey Yang Procedure Date: 12/13/2020 10:38 AM MRN: [id number] Account #: [id number] Date of Birth: 1955-08-13 Admit Type: Outpatient Age: 65 Room: [facility] ENDO ROOM 4 Gender: Male Note Status: Finalized Instrument Name: Jasper Riling [id number] Procedure:             Colonoscopy Indications:           Surveillance: Personal history of colonic polyps                         (unknown histology) on last colonoscopy more than 5                         years ago Providers:             Jonathon Bellows MD, MD Referring MD:          Yvetta Coder. Arnett (Referring MD) Medicines:             Monitored Anesthesia Care Complications:         No immediate complications. Procedure:             Pre-Anesthesia Assessment:                        - Prior to the procedure, a History and Physical was                         performed, and patient medications, allergies and                         sensitivities were reviewed. The patient's tolerance                         of previous anesthesia was reviewed.                        - The risks and benefits of the procedure and the                         sedation options and risks were discussed with the                         patient. All questions were answered and informed                         consent was obtained.                        - ASA Grade Assessment: II - A patient with mild                         systemic disease.                        After obtaining informed consent, the colonoscope was                         passed under direct vision. Throughout the procedure,                         the patient's blood pressure,  pulse, and oxygen                         saturations were monitored continuously. The                         Colonoscope was introduced through the anus and                         advanced to the the cecum, identified by the                         appendiceal  orifice. The colonoscopy was performed                         with ease. The patient tolerated the procedure well.                         The quality of the bowel preparation was excellent. Findings:      The perianal and digital rectal examinations were normal.      A 5 mm polyp was found in the ascending colon. The polyp was sessile.       The polyp was removed with a cold snare. Resection and retrieval were       complete.      The exam was otherwise without abnormality on direct and retroflexion       views. Impression:            - One 5 mm polyp in the ascending colon, removed with                         a cold snare. Resected and retrieved.                        - The examination was otherwise normal on direct and                         retroflexion views. Recommendation:        - Discharge patient to home (with escort).                        - Resume previous diet.                        - Continue present medications.                        - Await pathology results.                        - Repeat colonoscopy for surveillance based on                         pathology results. Procedure Code(s):     --- Professional ---                        [phone], Colonoscopy, flexible; with removal of                         tumor(s), polyp(s), or other lesion(s)  by snare                         technique Diagnosis Code(s):     --- Professional ---                        Z86.010, Personal history of colonic polyps                        K63.5, Polyp of colon CPT copyright 2019 American Medical Association. All rights reserved. The codes documented in this report are preliminary and upon coder review may  be revised to meet current compliance requirements. Jonathon Bellows, MD Jonathon Bellows MD, MD 12/13/2020 11:00:14 AM This report has been signed electronically. Number of Addenda: 0 Note Initiated On: 12/13/2020 10:38 AM Scope Withdrawal Time: 0 hours 7 minutes 13 seconds  Total  Procedure Duration: 0 hours 8 minutes 44 seconds  Estimated Blood Loss:  Estimated blood loss: none.      [facility]

## 2020-12-13 NOTE — H&P (Signed)
Jeffrey Bellows, MD [address], [place], [place], [place], [address] [address], [place], [place], [place], [address] Phone: [phone]  Fax: [phone]  Primary Care Physician:  Jeffrey Hawthorne, FNP   Pre-Procedure History & Physical: HPI:  Jeffrey Yang is a 65 y.o. male is here for an colonoscopy.   Past Medical History:  Diagnosis Date   Arthritis    Depression    GERD (gastroesophageal reflux disease)    Hypertension    Polyp of colon     Past Surgical History:  Procedure Laterality Date   BACK SURGERY     LUMBAR SPINE SURGERY  1990   Dr Jeffrey Yang neurosurgery   LUMBAR SPINE SURGERY  2001   Dr Jeffrey Yang   NECK SURGERY  1994, 1997, 2001   plate and 2 screws in neck; 2001 cerical fusion Dr Jeffrey Yang   OTHER SURGICAL HISTORY     right knee Right 1999   meniscal repair, arthoscopic   TONSILLECTOMY  1965    Prior to Admission medications   Medication Sig Start Date End Date Taking? Authorizing Provider  amLODipine (NORVASC) 10 MG tablet TAKE 1 TABLET BY MOUTH EVERY DAY 05/08/20  Yes Arnett, Yvetta Coder, FNP  DULoxetine (CYMBALTA) 30 MG capsule Take 2 capsules (60 mg total) by mouth daily. 07/24/20  Yes Arnett, Yvetta Coder, FNP  nebivolol (BYSTOLIC) 5 MG tablet Take 0.5 tablets (2.5 mg total) by mouth daily. 07/24/20  Yes Jeffrey Hawthorne, FNP  rosuvastatin (CRESTOR) 10 MG tablet TAKE 1 TABLET BY MOUTH EVERY DAY 05/20/20  Yes Jeffrey Hawthorne, FNP  traZODone (DESYREL) 50 MG tablet TAKE 1/2 TO 1 TABLET BY MOUTH AT BEDTIME AS NEEDED FOR SLEEP 10/01/20  Yes Arnett, Yvetta Coder, FNP  cyclobenzaprine (FLEXERIL) 10 MG tablet TAKE 1/2 TABLET BY MOUTH 3 TIMES DAILY AS NEEDED FOR MUSCLE SPASMS. 07/02/20   Jeffrey Hawthorne, FNP  gabapentin (NEURONTIN) 100 MG capsule TAKE 1 CAPSULE BY MOUTH THREE TIMES A DAY 07/02/20   Jeffrey Hawthorne, FNP  meloxicam (MOBIC) 15 MG tablet TAKE 1 TABLET BY MOUTH EVERY DAY AS NEEDED FOR PAIN 10/02/20   Jeffrey Hawthorne, FNP    Allergies as of  10/28/2020 - Review Complete 10/25/2020  Allergen Reaction Noted   Codeine Itching 09/12/2020    Family History  Problem Relation Age of Onset   Hypertension Father    Brain cancer Father 76   COPD Mother 58   Heart attack Neg Hx     Social History   Socioeconomic History   Marital status: Married    Spouse name: Not on file   Number of children: Not on file   Years of education: Not on file   Highest education level: Not on file  Occupational History   Not on file  Tobacco Use   Smoking status: Never   Smokeless tobacco: Never  Vaping Use   Vaping Use: Never used  Substance and Sexual Activity   Alcohol use: Yes   Drug use: Never   Sexual activity: Not Currently  Other Topics Concern   Not on file  Social History Narrative   2 girl grandchildren - 48, and 1 years   Wife is patient of mine      2003 - went on disability   Social Determinants of Radio broadcast assistant Strain: Not on file  Food Insecurity: Not on file  Transportation Needs: Not on file  Physical Activity: Not on file  Stress: Not on file  Social Connections: Not on file  Intimate Partner Violence: Not on file    Review of Systems: See HPI, otherwise negative ROS  Physical Exam: BP (!) 151/94   Pulse 60   Temp 97.8 F (36.6 C) (Temporal)   Resp 16   Ht 5\' 7"  (1.702 m)   Wt 75 kg   SpO2 100%   BMI 25.90 kg/m  General:   Alert,  pleasant and cooperative in NAD Head:  Normocephalic and atraumatic. Neck:  Supple; no masses or thyromegaly. Lungs:  Clear throughout to auscultation, normal respiratory effort.    Heart:  +S1, +S2, Regular rate and rhythm, No edema. Abdomen:  Soft, nontender and nondistended. Normal bowel sounds, without guarding, and without rebound.   Neurologic:  Alert and  oriented x4;  grossly normal neurologically.  Impression/Plan: Jeffrey Yang is here for an colonoscopy to be performed for surveillance due to prior history of colon polyps   Risks, benefits,  limitations, and alternatives regarding  colonoscopy have been reviewed with the patient.  Questions have been answered.  All parties agreeable.   Jeffrey Bellows, MD  12/13/2020, 10:41 AM

## 2020-12-13 NOTE — Anesthesia Postprocedure Evaluation (Signed)
Anesthesia Post Note  Patient: JCION BUDDENHAGEN  Procedure(s) Performed: COLONOSCOPY WITH PROPOFOL  Patient location during evaluation: PACU Anesthesia Type: General Level of consciousness: awake and alert, oriented and patient cooperative Pain management: pain level controlled Vital Signs Assessment: post-procedure vital signs reviewed and stable Respiratory status: spontaneous breathing, nonlabored ventilation and respiratory function stable Cardiovascular status: blood pressure returned to baseline and stable Postop Assessment: adequate PO intake Anesthetic complications: no   No notable events documented.   Last Vitals:  Vitals:   12/13/20 1122 12/13/20 1132  BP: 135/81 127/79  Pulse: (!) 53 (!) 57  Resp: 11 14  Temp:    SpO2: 98% (!) 85%    Last Pain:  Vitals:   12/13/20 1132  TempSrc:   PainSc: 0-No pain                 Darrin Nipper

## 2020-12-13 NOTE — Anesthesia Preprocedure Evaluation (Signed)
Anesthesia Evaluation  Patient identified by MRN, date of birth, ID band Patient awake    Reviewed: Allergy & Precautions, NPO status , Patient's Chart, lab work & pertinent test results  History of Anesthesia Complications Negative for: history of anesthetic complications  Airway Mallampati: III   Neck ROM: Full    Dental  (+) Edentulous Upper, Edentulous Lower   Pulmonary neg pulmonary ROS,    Pulmonary exam normal breath sounds clear to auscultation       Cardiovascular hypertension, Normal cardiovascular exam Rhythm:Regular Rate:Normal  ECG 01/29/20: Sinus bradycardia (HR 59), otherwise normal   Neuro/Psych PSYCHIATRIC DISORDERS Anxiety Depression    GI/Hepatic negative GI ROS,   Endo/Other  negative endocrine ROS  Renal/GU negative Renal ROS     Musculoskeletal  (+) Arthritis ,   Abdominal   Peds  Hematology negative hematology ROS (+)   Anesthesia Other Findings   Reproductive/Obstetrics                             Anesthesia Physical Anesthesia Plan  ASA: 2  Anesthesia Plan: General   Post-op Pain Management:    Induction: Intravenous  PONV Risk Score and Plan: 2 and Propofol infusion, TIVA and Treatment may vary due to age or medical condition  Airway Management Planned: Natural Airway  Additional Equipment:   Intra-op Plan:   Post-operative Plan:   Informed Consent: I have reviewed the patients History and Physical, chart, labs and discussed the procedure including the risks, benefits and alternatives for the proposed anesthesia with the patient or authorized representative who has indicated his/her understanding and acceptance.       Plan Discussed with: CRNA  Anesthesia Plan Comments:         Anesthesia Quick Evaluation

## 2020-12-13 NOTE — Transfer of Care (Signed)
Immediate Anesthesia Transfer of Care Note  Patient: TEJ MURDAUGH  Procedure(s) Performed: COLONOSCOPY WITH PROPOFOL  Patient Location: PACU  Anesthesia Type:General  Level of Consciousness: sedated  Airway & Oxygen Therapy: Patient Spontanous Breathing and Patient connected to nasal cannula oxygen  Post-op Assessment: Report given to RN and Post -op Vital signs reviewed and stable  Post vital signs: Reviewed and stable  Last Vitals:  Vitals Value Taken Time  BP 109/67 12/13/20 1102  Temp 36.2 C 12/13/20 1102  Pulse 61 12/13/20 1103  Resp 10 12/13/20 1103  SpO2 96 % 12/13/20 1103  Vitals shown include unvalidated device data.  Last Pain:  Vitals:   12/13/20 1102  TempSrc: Tympanic  PainSc: 0-No pain         Complications: No notable events documented.

## 2020-12-16 ENCOUNTER — Encounter: Payer: Self-pay | Admitting: Gastroenterology

## 2020-12-16 LAB — SURGICAL PATHOLOGY

## 2020-12-17 ENCOUNTER — Encounter: Payer: Self-pay | Admitting: Gastroenterology

## 2021-02-12 ENCOUNTER — Other Ambulatory Visit: Payer: Self-pay

## 2021-02-12 ENCOUNTER — Encounter: Payer: Self-pay | Admitting: Family Medicine

## 2021-02-12 ENCOUNTER — Telehealth (INDEPENDENT_AMBULATORY_CARE_PROVIDER_SITE_OTHER): Payer: Medicare Other | Admitting: Family Medicine

## 2021-02-12 DIAGNOSIS — R21 Rash and other nonspecific skin eruption: Secondary | ICD-10-CM | POA: Diagnosis not present

## 2021-02-12 MED ORDER — TRIAMCINOLONE ACETONIDE 0.5 % EX OINT: 1.0000 "application " | TOPICAL_OINTMENT | Freq: Two times a day (BID) | CUTANEOUS | 0 refills | Status: AC

## 2021-02-12 NOTE — Progress Notes (Signed)
Virtual Visit via video Note  This visit type was conducted due to national recommendations for restrictions regarding the COVID-19 pandemic (e.g. social distancing).  This format is felt to be most appropriate for this patient at this time.  All issues noted in this document were discussed and addressed.  No physical exam was performed (except for noted visual exam findings with Video Visits).   I connected with Jeffrey Yang today at 11:30 AM EST by a video enabled telemedicine application and verified that I am speaking with the correct person using two identifiers. Location patient: home Location provider: work  Persons participating in the virtual visit: patient, provider, Daejon Lich (wife)  I discussed the limitations, risks, security and privacy concerns of performing an evaluation and management service by telephone and the availability of in person appointments. I also discussed with the patient that there may be a patient responsible charge related to this service. The patient expressed understanding and agreed to proceed.  Reason for visit: Same-day visit.  HPI: Rash: Patient notes onset of rash 3 weeks ago on his epigastric region.  It has spread up his chest.  It is bilateral.  It itches and burns.  He notes initially there was some purulence coming out of the lesions though that has resolved.  He reports having flu/RSV about 3 weeks ago and had fevers and was sweating at that time.  Notes this came on with the sweating.  He notes no changes to soaps or detergents.  No new medications.  No known contacts with similar rash.  They use topical over-the-counter hydrocortisone that does help relieve the itch briefly though does not resolve the symptoms.  He has had no recurrent fevers.  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  He notes this appears different than his prior episodes of Grovers.   ROS: See pertinent positives and negatives per HPI.  Past Medical History:  Diagnosis  Date   Arthritis    Depression    GERD (gastroesophageal reflux disease)    Hypertension    Polyp of colon     Past Surgical History:  Procedure Laterality Date   BACK SURGERY     COLONOSCOPY WITH PROPOFOL N/A 12/13/2020   Procedure: COLONOSCOPY WITH PROPOFOL;  Surgeon: Jonathon Bellows, MD;  Location: [facility] ENDOSCOPY;  Service: Gastroenterology;  Laterality: N/A;   LUMBAR SPINE SURGERY  1990   Dr Quentin Cornwall neurosurgery   LUMBAR SPINE SURGERY  2001   Dr Astrid Drafts   NECK SURGERY  1994, 1997, 2001   plate and 2 screws in neck; 2001 cerical fusion Dr Doristine Counter   OTHER SURGICAL HISTORY     right knee Right 1999   meniscal repair, arthoscopic   TONSILLECTOMY  1965    Family History  Problem Relation Age of Onset   Hypertension Father    Brain cancer Father 13   COPD Mother 24   Heart attack Neg Hx     SOCIAL HX: Non-smoker   Current Outpatient Medications:    amLODipine (NORVASC) 10 MG tablet, TAKE 1 TABLET BY MOUTH EVERY DAY, Disp: 90 tablet, Rfl: 3   cyclobenzaprine (FLEXERIL) 10 MG tablet, TAKE 1/2 TABLET BY MOUTH 3 TIMES DAILY AS NEEDED FOR MUSCLE SPASMS., Disp: 30 tablet, Rfl: 1   DULoxetine (CYMBALTA) 30 MG capsule, Take 2 capsules (60 mg total) by mouth daily., Disp: 120 capsule, Rfl: 3   gabapentin (NEURONTIN) 100 MG capsule, TAKE 1 CAPSULE BY MOUTH THREE TIMES A DAY, Disp: 90 capsule, Rfl: 3  meloxicam (MOBIC) 15 MG tablet, TAKE 1 TABLET BY MOUTH EVERY DAY AS NEEDED FOR PAIN, Disp: 30 tablet, Rfl: 1   nebivolol (BYSTOLIC) 5 MG tablet, Take 0.5 tablets (2.5 mg total) by mouth daily., Disp: 90 tablet, Rfl: 1   rosuvastatin (CRESTOR) 10 MG tablet, TAKE 1 TABLET BY MOUTH EVERY DAY, Disp: 90 tablet, Rfl: 3   traZODone (DESYREL) 50 MG tablet, TAKE 1/2 TO 1 TABLET BY MOUTH AT BEDTIME AS NEEDED FOR SLEEP, Disp: 90 tablet, Rfl: 1   triamcinolone ointment (KENALOG) 0.5 %, Apply 1 application topically 2 (two) times daily., Disp: 30 g, Rfl: 0  EXAM:  VITALS per patient if  applicable:  GENERAL: alert, oriented, appears well and in no acute distress  HEENT: atraumatic, conjunttiva clear, no obvious abnormalities on inspection of external nose and ears  NECK: normal movements of the head and neck  LUNGS: on inspection no signs of respiratory distress, breathing rate appears normal, no obvious gross SOB, gasping or wheezing  CV: no obvious cyanosis  Skin: Patient has an erythematous papular excoriated rash on his chest  ASSESSMENT AND PLAN:  Discussed the following assessment and plan:  Problem List Items Addressed This Visit     Rash    His rash could represent a flare of his Grovers versus heat rash particularly in the setting of significant sweating previously.  Discussed treatment with topical steroid.  Topical triamcinolone will be sent to his pharmacy.  If this is not helpful we could try an oral steroid as that appears to be a possible treatment option for Grovers.  He will let us know if this is not improving.      Relevant Medications   triamcinolone ointment (KENALOG) 0.5 %    Return if symptoms worsen or fail to improve.   I discussed the assessment and treatment plan with the patient. The patient was provided an opportunity to ask questions and all were answered. The patient agreed with the plan and demonstrated an understanding of the instructions.   The patient was advised to call back or seek an in-person evaluation if the symptoms worsen or if the condition fails to improve as anticipated.  Tommi Rumps, MD

## 2021-02-12 NOTE — Assessment & Plan Note (Signed)
His rash could represent a flare of his Grovers versus heat rash particularly in the setting of significant sweating previously.  Discussed treatment with topical steroid.  Topical triamcinolone will be sent to his pharmacy.  If this is not helpful we could try an oral steroid as that appears to be a possible treatment option for Grovers.  He will let us know if this is not improving.

## 2021-02-24 ENCOUNTER — Ambulatory Visit (INDEPENDENT_AMBULATORY_CARE_PROVIDER_SITE_OTHER): Payer: Medicare Other | Admitting: Family

## 2021-02-24 ENCOUNTER — Other Ambulatory Visit: Payer: Self-pay

## 2021-02-24 ENCOUNTER — Encounter: Payer: Self-pay | Admitting: Family

## 2021-02-24 VITALS — BP 112/62 | HR 60 | Temp 97.7°F | Ht 67.0 in | Wt 169.2 lb

## 2021-02-24 DIAGNOSIS — F419 Anxiety disorder, unspecified: Secondary | ICD-10-CM

## 2021-02-24 DIAGNOSIS — G4700 Insomnia, unspecified: Secondary | ICD-10-CM

## 2021-02-24 DIAGNOSIS — R21 Rash and other nonspecific skin eruption: Secondary | ICD-10-CM | POA: Diagnosis not present

## 2021-02-24 DIAGNOSIS — I10 Essential (primary) hypertension: Secondary | ICD-10-CM | POA: Diagnosis not present

## 2021-02-24 DIAGNOSIS — Z125 Encounter for screening for malignant neoplasm of prostate: Secondary | ICD-10-CM | POA: Diagnosis not present

## 2021-02-24 DIAGNOSIS — F32A Depression, unspecified: Secondary | ICD-10-CM

## 2021-02-24 LAB — COMPREHENSIVE METABOLIC PANEL
ALT: 10 U/L (ref 0–53)
AST: 13 U/L (ref 0–37)
Albumin: 4.3 g/dL (ref 3.5–5.2)
Alkaline Phosphatase: 86 U/L (ref 39–117)
BUN: 13 mg/dL (ref 6–23)
CO2: 28 mEq/L (ref 19–32)
Calcium: 9.5 mg/dL (ref 8.4–10.5)
Chloride: 102 mEq/L (ref 96–112)
Creatinine, Ser: 1.04 mg/dL (ref 0.40–1.50)
GFR: 75.42 mL/min (ref >60.00)
Glucose, Bld: 134 mg/dL — ABNORMAL HIGH (ref 70–99)
Potassium: 4.3 mEq/L (ref 3.5–5.1)
Sodium: 137 mEq/L (ref 135–145)
Total Bilirubin: 0.5 mg/dL (ref 0.2–1.2)
Total Protein: 6.7 g/dL (ref 6.0–8.3)

## 2021-02-24 LAB — CBC WITH DIFFERENTIAL/PLATELET
Basophils Absolute: 0.1 10*3/uL (ref 0.0–0.1)
Basophils Relative: 0.7 % (ref 0.0–3.0)
Eosinophils Absolute: 0.4 10*3/uL (ref 0.0–0.7)
Eosinophils Relative: 4.9 % (ref 0.0–5.0)
HCT: 45.8 % (ref 39.0–52.0)
Hemoglobin: 15.4 g/dL (ref 13.0–17.0)
Lymphocytes Relative: 30.8 % (ref 12.0–46.0)
Lymphs Abs: 2.5 10*3/uL (ref 0.7–4.0)
MCHC: 33.7 g/dL (ref 30.0–36.0)
MCV: 92.3 fl (ref 78.0–100.0)
Monocytes Absolute: 0.5 10*3/uL (ref 0.1–1.0)
Monocytes Relative: 6 % (ref 3.0–12.0)
Neutro Abs: 4.7 10*3/uL (ref 1.4–7.7)
Neutrophils Relative %: 57.6 % (ref 43.0–77.0)
Platelets: 233 10*3/uL (ref 150.0–400.0)
RBC: 4.96 Mil/uL (ref 4.22–5.81)
RDW: 14 % (ref 11.5–15.5)
WBC: 8.2 10*3/uL (ref 4.0–10.5)

## 2021-02-24 LAB — LIPID PANEL
Cholesterol: 132 mg/dL (ref 0–200)
HDL: 55.9 mg/dL (ref >39.00)
LDL Cholesterol: 58 mg/dL (ref 0–99)
NonHDL: 75.76
Total CHOL/HDL Ratio: 2
Triglycerides: 87 mg/dL (ref 0.0–149.0)
VLDL: 17.4 mg/dL (ref 0.0–40.0)

## 2021-02-24 LAB — VITAMIN D 25 HYDROXY (VIT D DEFICIENCY, FRACTURES): VITD: 45.34 ng/mL (ref 30.00–100.00)

## 2021-02-24 LAB — HEMOGLOBIN A1C: Hgb A1c MFr Bld: 5.9 % (ref 4.6–6.5)

## 2021-02-24 LAB — PSA: PSA: 2.29 ng/mL (ref 0.10–4.00)

## 2021-02-24 MED ORDER — AMLODIPINE BESYLATE 10 MG PO TABS: 10.0000 mg | ORAL_TABLET | Freq: Every day | ORAL | 3 refills | Status: DC

## 2021-02-24 NOTE — Assessment & Plan Note (Signed)
Chronic, stable.  Continue Cymbalta 60 mg

## 2021-02-24 NOTE — Assessment & Plan Note (Signed)
Excellent control.  Continue Bystolic 2.5mg  , amlodipine 10 mg.

## 2021-02-24 NOTE — Progress Notes (Signed)
Subjective:    Patient ID: Jeffrey Yang, male    DOB: 1955/04/27, 66 y.o.   MRN: [id number]  CC: Jeffrey Yang is a 66 y.o. male who presents today for follow up.   HPI: Feels well today No new complaints Seen for rash 02/13/21 and prescribed triamcinolone cream.  Rash on trunk is largely resolved.  He has a history Grover's disease and sun exposure. No new skin lesions otherwise.  He is interested in dermatology referral  HTN-compliant with  Bystolic 2.5mg  , amlodipine 10 mg. No cp, sob.   Anxiety depression-compliant with Cymbalta 60 mg.  He is doing well on this regimen.no si/hi  Insomnia -compliant with trazodone.   Denies urinary hesitancy, decreased urine flow.   HISTORY:  Past Medical History:  Diagnosis Date   Arthritis    Depression    GERD (gastroesophageal reflux disease)    Hypertension    Polyp of colon    Past Surgical History:  Procedure Laterality Date   BACK SURGERY     COLONOSCOPY WITH PROPOFOL N/A 12/13/2020   Procedure: COLONOSCOPY WITH PROPOFOL;  Surgeon: Jonathon Bellows, MD;  Location: [facility] ENDOSCOPY;  Service: Gastroenterology;  Laterality: N/A;   LUMBAR SPINE SURGERY  1990   Dr Quentin Cornwall neurosurgery   LUMBAR SPINE SURGERY  2001   Dr Astrid Drafts   NECK SURGERY  1994, 1997, 2001   plate and 2 screws in neck; 2001 cerical fusion Dr Doristine Counter   OTHER SURGICAL HISTORY     right knee Right 1999   meniscal repair, arthoscopic   TONSILLECTOMY  1965   Family History  Problem Relation Age of Onset   Hypertension Father    Brain cancer Father 28   COPD Mother 51   Heart attack Neg Hx     Allergies: Codeine Current Outpatient Medications on File Prior to Visit  Medication Sig Dispense Refill   cyclobenzaprine (FLEXERIL) 10 MG tablet TAKE 1/2 TABLET BY MOUTH 3 TIMES DAILY AS NEEDED FOR MUSCLE SPASMS. 30 tablet 1   DULoxetine (CYMBALTA) 30 MG capsule Take 2 capsules (60 mg total) by mouth daily. 120 capsule 3   gabapentin (NEURONTIN) 100 MG capsule TAKE 1  CAPSULE BY MOUTH THREE TIMES A DAY 90 capsule 3   meloxicam (MOBIC) 15 MG tablet TAKE 1 TABLET BY MOUTH EVERY DAY AS NEEDED FOR PAIN 30 tablet 1   nebivolol (BYSTOLIC) 5 MG tablet Take 0.5 tablets (2.5 mg total) by mouth daily. 90 tablet 1   rosuvastatin (CRESTOR) 10 MG tablet TAKE 1 TABLET BY MOUTH EVERY DAY 90 tablet 3   traZODone (DESYREL) 50 MG tablet TAKE 1/2 TO 1 TABLET BY MOUTH AT BEDTIME AS NEEDED FOR SLEEP 90 tablet 1   triamcinolone ointment (KENALOG) 0.5 % Apply 1 application topically 2 (two) times daily. 30 g 0   No current facility-administered medications on file prior to visit.    Social History   Tobacco Use   Smoking status: Never   Smokeless tobacco: Never  Vaping Use   Vaping Use: Never used  Substance Use Topics   Alcohol use: Yes   Drug use: Never    Review of Systems  Constitutional:  Negative for chills and fever.  Respiratory:  Negative for cough.   Cardiovascular:  Negative for chest pain and palpitations.  Gastrointestinal:  Negative for nausea and vomiting.  Skin:  Negative for rash (resolved).     Objective:    BP 112/62 (BP Location: Left Arm, Patient Position: Sitting, Cuff Size:  Large)    Pulse 60    Temp 97.7 F (36.5 C) (Oral)    Ht 5\' 7"  (1.702 m)    Wt 169 lb 3.2 oz (76.7 kg)    SpO2 98%    BMI 26.50 kg/m  BP Readings from Last 3 Encounters:  02/24/21 112/62  12/13/20 127/79  10/25/20 118/60   Wt Readings from Last 3 Encounters:  02/24/21 169 lb 3.2 oz (76.7 kg)  02/12/21 165 lb (74.8 kg)  12/13/20 165 lb 5.5 oz (75 kg)    Physical Exam Vitals reviewed.  Constitutional:      Appearance: He is well-developed.  Cardiovascular:     Rate and Rhythm: Regular rhythm.     Heart sounds: Normal heart sounds.  Pulmonary:     Effort: Pulmonary effort is normal. No respiratory distress.     Breath sounds: Normal breath sounds. No wheezing, rhonchi or rales.  Skin:    General: Skin is warm and dry.  Neurological:     Mental Status: He  is alert.  Psychiatric:        Speech: Speech normal.        Behavior: Behavior normal.       Assessment & Plan:   Problem List Items Addressed This Visit       Cardiovascular and Mediastinum   Essential hypertension    Excellent control.  Continue Bystolic 2.5mg  , amlodipine 10 mg.      Relevant Medications   amLODipine (NORVASC) 10 MG tablet   Other Relevant Orders   CBC with Differential/Platelet   Comprehensive metabolic panel   Hemoglobin A1c   Lipid panel   VITAMIN D 25 Hydroxy (Vit-D Deficiency, Fractures)   PSA     Musculoskeletal and Integument   Rash - Primary    Resolved.  Jointly agreed surveillance by dermatology as it relates to Grovers disease as well as skin exam due to h/o sun exposure. I have placed referral.      Relevant Orders   Ambulatory referral to Dermatology     Other   Anxiety and depression    Chronic, stable.  Continue Cymbalta 60 mg      Insomnia    Chronic, stable.  Continue trazodone 50 mg      Other Visit Diagnoses     Screening for prostate cancer       Relevant Orders   PSA        I have changed Jeffrey Yang's amLODipine. I am also having him maintain his rosuvastatin, cyclobenzaprine, gabapentin, DULoxetine, nebivolol, traZODone, meloxicam, and triamcinolone ointment.   Meds ordered this encounter  Medications   amLODipine (NORVASC) 10 MG tablet    Sig: Take 1 tablet (10 mg total) by mouth daily.    Dispense:  90 tablet    Refill:  3    Order Specific Question:   Supervising Provider    Answer:   Crecencio Mc [[id number]]    Return precautions given.   Risks, benefits, and alternatives of the medications and treatment plan prescribed today were discussed, and patient expressed understanding.   Education regarding symptom management and diagnosis given to patient on AVS.  Continue to follow with Burnard Hawthorne, FNP for routine health maintenance.   [name] and I agreed with plan.   Jeffrey Paris,  FNP

## 2021-02-24 NOTE — Assessment & Plan Note (Signed)
Resolved.  Jointly agreed surveillance by dermatology as it relates to Grovers disease as well as skin exam due to h/o sun exposure. I have placed referral.

## 2021-02-24 NOTE — Assessment & Plan Note (Signed)
Chronic, stable.  Continue trazodone 50 mg

## 2021-02-27 ENCOUNTER — Other Ambulatory Visit: Payer: Self-pay

## 2021-02-27 DIAGNOSIS — R972 Elevated prostate specific antigen [PSA]: Secondary | ICD-10-CM

## 2021-03-17 NOTE — Progress Notes (Signed)
03/18/21 4:27 PM   Jeffrey Yang 13-Nov-1955 [id number]  Referring provider:  Burnard Hawthorne, FNP [address] [place],  [place] [address] Chief Complaint  Patient presents with   Elevated PSA     HPI: Jeffrey Yang is a 66 y.o.male who presents today for further evaluation of elevated PSA.   His PSA is monitored by his PCP, his most recent PSA was 2.29 with a previous PSA of 1.53 on 04/28/2019.   He has no family history of prostate cancer.   Minimal to no urinary symptoms.   IPSS     Row Name 03/18/21 1600         International Prostate Symptom Score   How often have you had the sensation of not emptying your bladder? Not at All     How often have you had to urinate less than every two hours? Less than 1 in 5 times     How often have you found you stopped and started again several times when you urinated? Not at All     How often have you found it difficult to postpone urination? Not at All     How often have you had a weak urinary stream? Not at All     How often have you had to strain to start urination? Not at All     How many times did you typically get up at night to urinate? 1 Time     Total IPSS Score 2       Quality of Life due to urinary symptoms   If you were to spend the rest of your life with your urinary condition just the way it is now how would you feel about that? Delighted              Score:  1-7 Mild 8-19 Moderate 20-35 Severe   PMH: Past Medical History:  Diagnosis Date   Arthritis    Depression    GERD (gastroesophageal reflux disease)    Hypertension    Polyp of colon     Surgical History: Past Surgical History:  Procedure Laterality Date   BACK SURGERY     COLONOSCOPY WITH PROPOFOL N/A 12/13/2020   Procedure: COLONOSCOPY WITH PROPOFOL;  Surgeon: Jonathon Bellows, MD;  Location: [facility] ENDOSCOPY;  Service: Gastroenterology;  Laterality: N/A;   LUMBAR SPINE SURGERY  1990   Dr Quentin Cornwall neurosurgery   LUMBAR SPINE  SURGERY  2001   Dr Astrid Drafts   NECK SURGERY  1994, 1997, 2001   plate and 2 screws in neck; 2001 cerical fusion Dr Doristine Counter   OTHER SURGICAL HISTORY     right knee Right 1999   meniscal repair, arthoscopic   TONSILLECTOMY  1965    Home Medications:  Allergies as of 03/18/2021       Reactions   Codeine Itching   SCRATCHY THROAT AND "FEEL WEIRD"        Medication List        Accurate as of March 18, 2021  4:27 PM. If you have any questions, ask your nurse or doctor.          amLODipine 10 MG tablet Commonly known as: NORVASC Take 1 tablet (10 mg total) by mouth daily.   cyclobenzaprine 10 MG tablet Commonly known as: FLEXERIL TAKE 1/2 TABLET BY MOUTH 3 TIMES DAILY AS NEEDED FOR MUSCLE SPASMS.   DULoxetine 30 MG capsule Commonly known as: Cymbalta Take 2 capsules (60 mg total) by mouth  daily.   gabapentin 100 MG capsule Commonly known as: NEURONTIN TAKE 1 CAPSULE BY MOUTH THREE TIMES A DAY   meloxicam 15 MG tablet Commonly known as: MOBIC TAKE 1 TABLET BY MOUTH EVERY DAY AS NEEDED FOR PAIN   nebivolol 5 MG tablet Commonly known as: BYSTOLIC Take 0.5 tablets (2.5 mg total) by mouth daily.   rosuvastatin 10 MG tablet Commonly known as: CRESTOR TAKE 1 TABLET BY MOUTH EVERY DAY   traZODone 50 MG tablet Commonly known as: DESYREL TAKE 1/2 TO 1 TABLET BY MOUTH AT BEDTIME AS NEEDED FOR SLEEP   triamcinolone ointment 0.5 % Commonly known as: KENALOG Apply 1 application topically 2 (two) times daily.        Allergies:  Allergies  Allergen Reactions   Codeine Itching    SCRATCHY THROAT AND "FEEL WEIRD"    Family History: Family History  Problem Relation Age of Onset   Hypertension Father    Brain cancer Father 62   COPD Mother 11   Prostate cancer Brother    Prostate cancer Brother    Heart attack Neg Hx     Social History:  reports that he has never smoked. He has never used smokeless tobacco. He reports current alcohol use. He reports that  he does not use drugs.   Physical Exam: BP (!) 148/75    Pulse (!) 58    Ht 5\' 7"  (1.702 m)    Wt 169 lb (76.7 kg)    BMI 26.47 kg/m   Constitutional:  Alert and oriented, No acute distress. HEENT: [place] AT, moist mucus membranes.  Trachea midline, no masses. Cardiovascular: No clubbing, cyanosis, or edema. Respiratory: Normal respiratory effort, no increased work of breathing. Rectal: Normal sphincter tone,  50  CC prostate, smooth no nodules Skin: No rashes, bruises or suspicious lesions. Neurologic: Grossly intact, no focal deficits, moving all 4 extremities. Psychiatric: Normal mood and affect.  Laboratory Data:  Lab Results  Component Value Date   CREATININE 1.04 02/24/2021   Lab Results  Component Value Date   PSA 2.29 02/24/2021   PSA 1.53 04/28/2019   Lab Results  Component Value Date   HGBA1C 5.9 02/24/2021    Pertinent Imaging: No results found for any visits on 03/18/21.   Assessment & Plan:    Elevated PSA -  We reviewed the implications of an elevated PSA and the uncertainty surrounding it. In general, a man's PSA increases with age and is produced by both normal and cancerous prostate tissue. Differential for elevated PSA is BPH, prostate cancer, infection, recent intercourse/ejaculation, prostate infarction, recent urethroscopic manipulation (foley placement/cystoscopy) and prostatitis. Management of an elevated PSA can include observation or prostate biopsy and wediscussed this in detail. We discussed that indications for prostate biopsy are defined by age and race specific PSA cutoffs as well as a PSA velocity of 0.75/year. - PSA; pending. Will call with results and decide further evaluation.  - Rectal exam today revealed    I,Kailey Littlejohn,acting as a scribe for Hollice Espy, MD.,have documented all relevant documentation on the behalf of Hollice Espy, MD,as directed by  Hollice Espy, MD while in the presence of Hollice Espy, MD.  I have  reviewed the above documentation for accuracy and completeness, and I agree with the above.   Hollice Espy, MD   [facility] [address], [place], [place] [address] [phone]

## 2021-03-18 ENCOUNTER — Ambulatory Visit: Payer: Medicare Other | Admitting: Urology

## 2021-03-18 ENCOUNTER — Encounter: Payer: Self-pay | Admitting: Urology

## 2021-03-18 ENCOUNTER — Other Ambulatory Visit: Payer: Self-pay

## 2021-03-18 VITALS — BP 148/75 | HR 58 | Ht 67.0 in | Wt 169.0 lb

## 2021-03-18 DIAGNOSIS — R972 Elevated prostate specific antigen [PSA]: Secondary | ICD-10-CM

## 2021-03-18 LAB — URINALYSIS, COMPLETE
Bilirubin, UA: NEGATIVE
Glucose, UA: NEGATIVE
Ketones, UA: NEGATIVE
Leukocytes,UA: NEGATIVE
Nitrite, UA: NEGATIVE
Protein,UA: NEGATIVE
RBC, UA: NEGATIVE
Specific Gravity, UA: 1.015 (ref 1.005–1.030)
Urobilinogen, Ur: 0.2 mg/dL (ref 0.2–1.0)
pH, UA: 6 (ref 5.0–7.5)

## 2021-03-18 LAB — MICROSCOPIC EXAMINATION
Bacteria, UA: NONE SEEN
RBC, Urine: NONE SEEN /hpf (ref 0–2)

## 2021-03-19 ENCOUNTER — Telehealth: Payer: Self-pay | Admitting: Urology

## 2021-03-19 LAB — PSA: Prostate Specific Ag, Serum: 2.3 ng/mL (ref 0.0–4.0)

## 2021-03-19 NOTE — Telephone Encounter (Signed)
Please let Mr. Jeffrey Yang know that his PSA is trending back down a little bit, now 2.3.  I think he given his recent rise, it be reasonable to have him come back and see [place] again in a year with another PSA rectal exam.  Would not recommend any further intervention at this time beyond this.  Hollice Espy, MD

## 2021-03-20 NOTE — Telephone Encounter (Signed)
Patient informed, voiced understanding. Scheduled appointment.

## 2021-04-07 ENCOUNTER — Other Ambulatory Visit: Payer: Self-pay | Admitting: Family

## 2021-04-07 DIAGNOSIS — G4700 Insomnia, unspecified: Secondary | ICD-10-CM

## 2021-05-01 ENCOUNTER — Other Ambulatory Visit: Payer: Self-pay | Admitting: Family

## 2021-05-02 ENCOUNTER — Telehealth: Payer: Self-pay | Admitting: Family

## 2021-05-02 NOTE — Telephone Encounter (Signed)
Left message to return call. ?Attempting to schedule Patient annual wellness visit  ?

## 2021-05-08 ENCOUNTER — Ambulatory Visit (INDEPENDENT_AMBULATORY_CARE_PROVIDER_SITE_OTHER): Payer: Medicare Other

## 2021-05-08 VITALS — Ht 67.0 in | Wt 165.0 lb

## 2021-05-08 DIAGNOSIS — Z0000 Encounter for general adult medical examination without abnormal findings: Secondary | ICD-10-CM | POA: Diagnosis not present

## 2021-05-08 NOTE — Patient Instructions (Addendum)
?  Mr. Lonon , ?Thank you for taking time to come for your Medicare Wellness Visit. I appreciate your ongoing commitment to your health goals. Please review the following plan we discussed and let me know if I can assist you in the future.  ? ?These are the goals we discussed: ? Goals   ? ?  Follow up with Primary Care Provider   ?  As needed. ?  ? ?  ?  ?This is a list of the screening recommended for you and due dates:  ?Health Maintenance  ?Topic Date Due  ? HIV Screening  10/25/2021*  ? Tetanus Vaccine  07/31/2021  ? Colon Cancer Screening  12/14/2027  ? Flu Shot  Completed  ? COVID-19 Vaccine  Completed  ? Hepatitis C Screening: USPSTF Recommendation to screen - Ages 60-79 yo.  Completed  ? HPV Vaccine  Aged Out  ? Pneumonia Vaccine  Discontinued  ? Zoster (Shingles) Vaccine  Discontinued  ?*Topic was postponed. The date shown is not the original due date.  ?  ?

## 2021-05-08 NOTE — Progress Notes (Signed)
Subjective:   Jeffrey Yang is a 66 y.o. male who presents for Medicare Annual/Subsequent preventive examination.  Review of Systems    No ROS.  Medicare Wellness Virtual Visit.  Visual/audio telehealth visit, UTA vital signs.   See social history for additional risk factors.   Cardiac Risk Factors include: advanced age (>46men, >36 women);male gender     Objective:    Today's Vitals   05/08/21 0822  Weight: 165 lb (74.8 kg)  Height: 5\' 7"  (1.702 m)   Body mass index is 25.84 kg/m.     05/08/2021    8:26 AM 12/13/2020   10:08 AM 08/11/2019   12:38 PM  Advanced Directives  Does Patient Have a Medical Advance Directive? No No No  Would patient like information on creating a medical advance directive? No - Patient declined  No - Patient declined    Current Medications (verified) Outpatient Encounter Medications as of 05/08/2021  Medication Sig   amLODipine (NORVASC) 10 MG tablet Take 1 tablet (10 mg total) by mouth daily.   cyclobenzaprine (FLEXERIL) 10 MG tablet TAKE 1/2 TABLET BY MOUTH 3 TIMES DAILY AS NEEDED FOR MUSCLE SPASMS.   DULoxetine (CYMBALTA) 30 MG capsule TAKE 2 CAPSULES BY MOUTH EVERY DAY   gabapentin (NEURONTIN) 100 MG capsule TAKE 1 CAPSULE BY MOUTH THREE TIMES A DAY   meloxicam (MOBIC) 15 MG tablet TAKE 1 TABLET BY MOUTH EVERY DAY AS NEEDED FOR PAIN   nebivolol (BYSTOLIC) 5 MG tablet Take 0.5 tablets (2.5 mg total) by mouth daily.   rosuvastatin (CRESTOR) 10 MG tablet TAKE 1 TABLET BY MOUTH EVERY DAY   traZODone (DESYREL) 50 MG tablet TAKE 1/2 TO 1 TABLET BY MOUTH AT BEDTIME AS NEEDED FOR SLEEP   triamcinolone ointment (KENALOG) 0.5 % Apply 1 application topically 2 (two) times daily.   No facility-administered encounter medications on file as of 05/08/2021.    Allergies (verified) Codeine   History: Past Medical History:  Diagnosis Date   Arthritis    Depression    GERD (gastroesophageal reflux disease)    Hypertension    Polyp of colon    Past  Surgical History:  Procedure Laterality Date   BACK SURGERY     COLONOSCOPY WITH PROPOFOL N/A 12/13/2020   Procedure: COLONOSCOPY WITH PROPOFOL;  Surgeon: Wyline Mood, MD;  Location: [facility] ENDOSCOPY;  Service: Gastroenterology;  Laterality: N/A;   LUMBAR SPINE SURGERY  1990   Dr [name] neurosurgery   LUMBAR SPINE SURGERY  2001   Dr Reita Cliche   NECK SURGERY  1994, 1997, 2001   plate and 2 screws in neck; 2001 cerical fusion Dr Kieth Brightly   OTHER SURGICAL HISTORY     right knee Right 1999   meniscal repair, arthoscopic   TONSILLECTOMY  1965   Family History  Problem Relation Age of Onset   Hypertension Father    Brain cancer Father 70   COPD Mother 60   Prostate cancer Brother    Prostate cancer Brother    Heart attack Neg Hx    Social History   Socioeconomic History   Marital status: Married    Spouse name: Not on file   Number of children: Not on file   Years of education: Not on file   Highest education level: Not on file  Occupational History   Not on file  Tobacco Use   Smoking status: Never   Smokeless tobacco: Never  Vaping Use   Vaping Use: Never used  Substance and Sexual Activity  Alcohol use: Yes   Drug use: Never   Sexual activity: Not Currently  Other Topics Concern   Not on file  Social History Narrative   2 girl grandchildren - 6, and 1 years   Wife is patient of mine      2003 - went on disability   Social Determinants of Corporate investment banker Strain: Low Risk    Difficulty of Paying Living Expenses: Not hard at all  Food Insecurity: No Food Insecurity   Worried About Programme researcher, broadcasting/film/video in the Last Year: Never true   Barista in the Last Year: Never true  Transportation Needs: No Transportation Needs   Lack of Transportation (Medical): No   Lack of Transportation (Non-Medical): No  Physical Activity: Not on file  Stress: No Stress Concern Present   Feeling of Stress : Only a little  Social Connections: Unknown   Frequency  of Communication with Friends and Family: Not on file   Frequency of Social Gatherings with Friends and Family: Not on file   Attends Religious Services: Not on Scientist, clinical (histocompatibility and immunogenetics) or Organizations: Not on file   Attends Banker Meetings: Not on file   Marital Status: Married    Tobacco Counseling Counseling given: Not Answered   Clinical Intake:  Pre-visit preparation completed: Yes        Diabetes: No  How often do you need to have someone help you when you read instructions, pamphlets, or other written materials from your doctor or pharmacy?: 1 - Never  Interpreter Needed?: No      Activities of Daily Living    05/08/2021    8:27 AM  In your present state of health, do you have any difficulty performing the following activities:  Hearing? 0  Vision? 0  Difficulty concentrating or making decisions? 0  Walking or climbing stairs? 0  Dressing or bathing? 0  Doing errands, shopping? 0  Preparing Food and eating ? N  Using the Toilet? N  In the past six months, have you accidently leaked urine? N  Do you have problems with loss of bowel control? N  Managing your Medications? N  Managing your Finances? N  Housekeeping or managing your Housekeeping? N    Patient Care Team: Allegra Grana, FNP as PCP - General (Family Medicine)  Indicate any recent Medical Services you may have received from other than [facility] providers in the past year (date may be approximate).     Assessment:   This is a routine wellness examination for Jeffrey Yang.  Virtual Visit via Telephone Note  I connected with  Jeffrey Yang on 05/08/21 at  8:15 AM EDT by telephone and verified that I am speaking with the correct person using two identifiers.  Persons participating in the virtual visit: patient/Nurse Health Advisor   I discussed the limitations of evaluation and management service by telehealth. The patient expressed understanding and agreed to proceed.  We  continued and completed visit with audio only.  Some vital signs may be absent or patient reported.   Hearing/Vision screen Hearing Screening - Comments:: Patient is able to hear conversational tones without difficulty. No issues reported.  Vision Screening - Comments:: Followed by [facility]  Wears corrective lenses They have seen their ophthalmologist in the last 12 months.   Dietary issues and exercise activities discussed: Current Exercise Habits: Home exercise routine, Intensity: Mild Healthy diet  Good water    Goals Addressed  This Visit's Progress    Follow up with Primary Care Provider       As needed.       Depression Screen    05/08/2021    8:24 AM 06/11/2020   10:37 AM 08/11/2019   12:40 PM 07/24/2019    8:15 AM 04/28/2019   11:23 AM 11/16/2018    8:30 AM 08/11/2018   10:11 AM  PHQ 2/9 Scores  PHQ - 2 Score 0 0 0 0 1 0 0  PHQ- 9 Score  4   5  0    Fall Risk    05/08/2021    8:27 AM 10/25/2020    9:12 AM 08/11/2019   12:39 PM 07/24/2019    8:15 AM 04/28/2019   10:33 AM  Fall Risk   Falls in the past year? 0 0 0 0 0  Number falls in past yr: 0 0 0 0   Injury with Fall?   0 0   Follow up Falls evaluation completed Falls evaluation completed Falls evaluation completed Falls evaluation completed Falls evaluation completed    FALL RISK PREVENTION PERTAINING TO THE HOME:  Home free of loose throw rugs in walkways, pet beds, electrical cords, etc? Yes  Adequate lighting in your home to reduce risk of falls? Yes   ASSISTIVE DEVICES UTILIZED TO PREVENT FALLS: Life alert? No  Use of a cane, walker or w/c? No   TIMED UP AND GO: Was the test performed? No .   Cognitive Function: Patient is alert and oriented x3.  Normal cognitive status assessed by direct observation/communication. No abnormalities found.      08/11/2019   12:50 PM  MMSE - Mini Mental State Exam  Not completed: Unable to complete        08/11/2019    1:11 PM  6CIT Screen   What Year? 0 points  What month? 0 points  What time? 0 points    Immunizations Immunization History  Administered Date(s) Administered   Fluad Quad(high Dose 65+) 10/25/2020   Influenza,inj,Quad PF,6+ Mos 11/12/2018, 01/29/2020   PFIZER(Purple Top)SARS-COV-2 Vaccination 05/12/2019, 06/06/2019, 02/03/2020   Pfizer Covid-19 Vaccine Bivalent Booster 2yrs & up 12/16/2020   Tdap 08/01/2011   Screening Tests Health Maintenance  Topic Date Due   HIV Screening  10/25/2021 (Originally 10/09/1970)   TETANUS/TDAP  07/31/2021   COLONOSCOPY (Pts 45-14yrs Insurance coverage will need to be confirmed)  12/14/2027   INFLUENZA VACCINE  Completed   COVID-19 Vaccine  Completed   Hepatitis C Screening  Completed   HPV VACCINES  Aged Out   Pneumonia Vaccine 49+ Years old  Discontinued   Zoster Vaccines- Shingrix  Discontinued   Health Maintenance There are no preventive care reminders to display for this patient.  Lung Cancer Screening: (Low Dose CT Chest recommended if Age 63-80 years, 30 pack-year currently smoking OR have quit w/in 15years.) does not qualify.   Hepatitis C Screening: Completed   Vision Screening: Recommended annual ophthalmology exams for early detection of glaucoma and other disorders of the eye.  Dental Screening: Recommended annual dental exams for proper oral hygiene  Community Resource Referral / Chronic Care Management: CRR required this visit?  No   CCM required this visit?  No      Plan:   Keep all routine maintenance appointments.   I have personally reviewed and noted the following in the patient's chart:   Medical and social history Use of alcohol, tobacco or illicit drugs  Current medications and supplements including  opioid prescriptions. Patient is not currently taking opioid prescriptions. Functional ability and status Nutritional status Physical activity Advanced directives List of other physicians Hospitalizations, surgeries, and ER visits  in previous 12 months Vitals Screenings to include cognitive, depression, and falls Referrals and appointments  In addition, I have reviewed and discussed with patient certain preventive protocols, quality metrics, and best practice recommendations. A written personalized care plan for preventive services as well as general preventive health recommendations were provided to patient via mychart.     Ashok Pall, LPN   1/61/0960

## 2021-05-31 ENCOUNTER — Other Ambulatory Visit: Payer: Self-pay | Admitting: Family

## 2021-05-31 DIAGNOSIS — E785 Hyperlipidemia, unspecified: Secondary | ICD-10-CM

## 2021-06-25 ENCOUNTER — Ambulatory Visit: Payer: Medicare Other | Admitting: Dermatology

## 2021-06-25 DIAGNOSIS — C44219 Basal cell carcinoma of skin of left ear and external auricular canal: Secondary | ICD-10-CM | POA: Diagnosis not present

## 2021-06-25 DIAGNOSIS — D1800 Hemangioma unspecified site: Secondary | ICD-10-CM

## 2021-06-25 DIAGNOSIS — L111 Transient acantholytic dermatosis [Grover]: Secondary | ICD-10-CM | POA: Diagnosis not present

## 2021-06-25 DIAGNOSIS — L570 Actinic keratosis: Secondary | ICD-10-CM

## 2021-06-25 DIAGNOSIS — Z1283 Encounter for screening for malignant neoplasm of skin: Secondary | ICD-10-CM | POA: Diagnosis not present

## 2021-06-25 DIAGNOSIS — D229 Melanocytic nevi, unspecified: Secondary | ICD-10-CM

## 2021-06-25 DIAGNOSIS — D492 Neoplasm of unspecified behavior of bone, soft tissue, and skin: Secondary | ICD-10-CM

## 2021-06-25 DIAGNOSIS — C4491 Basal cell carcinoma of skin, unspecified: Secondary | ICD-10-CM

## 2021-06-25 DIAGNOSIS — L814 Other melanin hyperpigmentation: Secondary | ICD-10-CM

## 2021-06-25 DIAGNOSIS — D2372 Other benign neoplasm of skin of left lower limb, including hip: Secondary | ICD-10-CM

## 2021-06-25 DIAGNOSIS — L578 Other skin changes due to chronic exposure to nonionizing radiation: Secondary | ICD-10-CM | POA: Diagnosis not present

## 2021-06-25 DIAGNOSIS — L821 Other seborrheic keratosis: Secondary | ICD-10-CM

## 2021-06-25 DIAGNOSIS — D692 Other nonthrombocytopenic purpura: Secondary | ICD-10-CM

## 2021-06-25 HISTORY — DX: Basal cell carcinoma of skin, unspecified: C44.91

## 2021-06-25 NOTE — Patient Instructions (Addendum)
Cryotherapy Aftercare  Wash gently with soap and water everyday.   Apply Vaseline and Band-Aid daily until healed.    Wound Care Instructions  Cleanse wound gently with soap and water once a day then pat dry with clean gauze. Apply a thing coat of Petrolatum (petroleum jelly, "Vaseline") over the wound (unless you have an allergy to this). We recommend that you use a new, sterile tube of Vaseline. Do not pick or remove scabs. Do not remove the yellow or white "healing tissue" from the base of the wound.  Cover the wound with fresh, clean, nonstick gauze and secure with paper tape. You may use Band-Aids in place of gauze and tape if the would is small enough, but would recommend trimming much of the tape off as there is often too much. Sometimes Band-Aids can irritate the skin.  You should call the office for your biopsy report after 1 week if you have not already been contacted.  If you experience any problems, such as abnormal amounts of bleeding, swelling, significant bruising, significant pain, or evidence of infection, please call the office immediately.  FOR ADULT SURGERY PATIENTS: If you need something for pain relief you may take 1 extra strength Tylenol (acetaminophen) AND 2 Ibuprofen (200mg each) together every 4 hours as needed for pain. (do not take these if you are allergic to them or if you have a reason you should not take them.) Typically, you may only need pain medication for 1 to 3 days.        If You Need Anything After Your Visit  If you have any questions or concerns for your doctor, please call our main line at [phone] and press option 4 to reach your doctor's medical assistant. If no one answers, please leave a voicemail as directed and we will return your call as soon as possible. Messages left after 4 pm will be answered the following business day.   You may also send us a message via MyChart. We typically respond to MyChart messages within 1-2 business  days.  For prescription refills, please ask your pharmacy to contact our office. Our fax number is [phone].  If you have an urgent issue when the clinic is closed that cannot wait until the next business day, you can page your doctor at the number below.    Please note that while we do our best to be available for urgent issues outside of office hours, we are not available 24/7.   If you have an urgent issue and are unable to reach us, you may choose to seek medical care at your doctor's office, retail clinic, urgent care center, or emergency room.  If you have a medical emergency, please immediately call 911 or go to the emergency department.  Pager Numbers  - Dr. Kowalski: [phone]  - Dr. Moye: [phone]  - Dr. Stewart: [phone]  In the event of inclement weather, please call our main line at [phone] for an update on the status of any delays or closures.  Dermatology Medication Tips: Please keep the boxes that topical medications come in in order to help keep track of the instructions about where and how to use these. Pharmacies typically print the medication instructions only on the boxes and not directly on the medication tubes.   If your medication is too expensive, please contact our office at [phone] option 4 or send us a message through MyChart.   We are unable to tell what your co-pay for medications will be   in advance as this is different depending on your insurance coverage. However, we may be able to find a substitute medication at lower cost or fill out paperwork to get insurance to cover a needed medication.   If a prior authorization is required to get your medication covered by your insurance company, please allow us 1-2 business days to complete this process.  Drug prices often vary depending on where the prescription is filled and some pharmacies may offer cheaper prices.  The website www.goodrx.com contains coupons for medications through  different pharmacies. The prices here do not account for what the cost may be with help from insurance (it may be cheaper with your insurance), but the website can give you the price if you did not use any insurance.  - You can print the associated coupon and take it with your prescription to the pharmacy.  - You may also stop by our office during regular business hours and pick up a GoodRx coupon card.  - If you need your prescription sent electronically to a different pharmacy, notify our office through [facility] MyChart or by phone at [phone] option 4.     Si Usted Necesita Algo Despus de Su Visita  Tambin puede enviarnos un mensaje a travs de MyChart. Por lo general respondemos a los mensajes de MyChart en el transcurso de 1 a 2 das hbiles.  Para renovar recetas, por favor pida a su farmacia que se ponga en contacto con nuestra oficina. Nuestro nmero de fax es el [phone].  Si tiene un asunto urgente cuando la clnica est cerrada y que no puede esperar hasta el siguiente da hbil, puede llamar/localizar a su doctor(a) al nmero que aparece a continuacin.   Por favor, tenga en cuenta que aunque hacemos todo lo posible para estar disponibles para asuntos urgentes fuera del horario de oficina, no estamos disponibles las 24 horas del da, los 7 das de la semana.   Si tiene un problema urgente y no puede comunicarse con nosotros, puede optar por buscar atencin mdica  en el consultorio de su doctor(a), en una clnica privada, en un centro de atencin urgente o en una sala de emergencias.  Si tiene una emergencia mdica, por favor llame inmediatamente al 911 o vaya a la sala de emergencias.  Nmeros de bper  - Dr. Kowalski: [phone]  - Dra. Moye: [phone]  - Dra. Stewart: [phone]  En caso de inclemencias del tiempo, por favor llame a nuestra lnea principal al [phone] para una actualizacin sobre el estado de cualquier retraso o cierre.  Consejos  para la medicacin en dermatologa: Por favor, guarde las cajas en las que vienen los medicamentos de uso tpico para ayudarle a seguir las instrucciones sobre dnde y cmo usarlos. Las farmacias generalmente imprimen las instrucciones del medicamento slo en las cajas y no directamente en los tubos del medicamento.   Si su medicamento es muy caro, por favor, pngase en contacto con nuestra oficina llamando al [phone] y presione la opcin 4 o envenos un mensaje a travs de MyChart.   No podemos decirle cul ser su copago por los medicamentos por adelantado ya que esto es diferente dependiendo de la cobertura de su seguro. Sin embargo, es posible que podamos encontrar un medicamento sustituto a menor costo o llenar un formulario para que el seguro cubra el medicamento que se considera necesario.   Si se requiere una autorizacin previa para que su compaa de seguros cubra su medicamento, por favor permtanos de 1 a   2 das hbiles para completar este proceso.  Los precios de los medicamentos varan con frecuencia dependiendo del lugar de dnde se surte la receta y alguna farmacias pueden ofrecer precios ms baratos.  El sitio web www.goodrx.com tiene cupones para medicamentos de diferentes farmacias. Los precios aqu no tienen en cuenta lo que podra costar con la ayuda del seguro (puede ser ms barato con su seguro), pero el sitio web puede darle el precio si no utiliz ningn seguro.  - Puede imprimir el cupn correspondiente y llevarlo con su receta a la farmacia.  - Tambin puede pasar por nuestra oficina durante el horario de atencin regular y recoger una tarjeta de cupones de GoodRx.  - Si necesita que su receta se enve electrnicamente a una farmacia diferente, informe a nuestra oficina a travs de MyChart de [facility] o por telfono llamando al [phone] y presione la opcin 4.  

## 2021-06-25 NOTE — Progress Notes (Signed)
? ?New Patient Visit ? ?Subjective  ?Jeffrey Yang is a 66 y.o. 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 ?The patient presents for Upper Body Skin Exam (UBSE) for skin cancer screening and mole check.  The patient has spots, moles and lesions to be evaluated, some may be new or changing and the patient has concerns that these could be cancer.  ? ?The following portions of the chart were reviewed this encounter and updated as appropriate:  ? Tobacco  Allergies  Meds  Problems  Med Hx  Surg Hx  Fam Hx   ?  ?Review of Systems:  No other skin or systemic complaints except as noted in HPI or Assessment and Plan. ? ?Objective  ?Well appearing patient in no apparent distress; mood and affect are within normal limits. ? ?All skin waist up examined. ? ?chest ?Clear today  ? ?Left ear sup ant tragus ?1.1 cm crusted papule  ? ? ? ? ?right temple x 1 ?Erythematous thin papules/macules with gritty scale.  ? ? 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  It more commonly presents in the winter and can be associated with atopic dermatitis, renal failure, transplantation and malignancies. ?It is very difficult to treat and can be persistent and recurrent but topical steroids, calcipotriene cream, antihistamines can be helpful.  For recalcitrant disease, other treatments have been used such as isotretinoin, acitretin, systemic steroids, and Dupixent. ? ?Mometasone cream qd-bid prn flares  ? ?Neoplasm of skin ?Left ear  superior anterior tragus and pre-auricular area ?Skin / nail biopsy ?Type of biopsy: tangential   ?Informed consent: discussed and consent obtained   ?Patient was prepped and draped in usual sterile fashion: area prepped with alochol. ?Anesthesia: the lesion was anesthetized in a standard fashion   ?Anesthetic:  1% lidocaine w/ epinephrine 1-100,000 buffered w/ 8.4% NaHCO3 ?Instrument used: flexible razor blade   ?Hemostasis achieved with: pressure, aluminum chloride and electrodesiccation   ?Outcome: patient tolerated procedure well   ?Post-procedure details: wound care instructions given   ?Post-procedure details comment:  Ointment and small bandage ? ?Specimen 1 - Surgical pathology ?Differential Diagnosis: R/O BCC ?Check Margins: No ? ?AK (actinic keratosis) ?right temple x 1 ?Actinic keratoses are precancerous spots that appear secondary to cumulative UV radiation exposure/sun exposure over time. They are chronic with expected duration over 1 year. A portion of actinic keratoses will progress to squamous cell carcinoma of the skin. It is not possible to reliably predict which spots will progress to skin cancer and so treatment is recommended to prevent development of skin cancer. ? ?Recommend daily broad spectrum sunscreen SPF 30+ to sun-exposed areas, reapply every 2 hours as needed.  ?Recommend staying in the shade or wearing long sleeves, sun glasses (UVA+UVB protection) and wide brim hats (4-inch brim around the entire circumference of the hat). ?Call for new or changing lesions.  ? ?Destruction of lesion - right temple x 1 ?Complexity: simple   ?Destruction method: cryotherapy   ?Informed consent: discussed and consent obtained   ?Timeout:  patient name, date of birth, surgical site, and procedure verified ?Lesion  destroyed using liquid nitrogen: Yes   ?Region frozen until ice ball extended beyond lesion: Yes   ?Outcome: patient tolerated procedure well with no complications   ?Post-procedure details:  wound care instructions given   ? ?Skin cancer screening ? ?Lentigines ?- Scattered tan macules ?- Due to sun exposure ?- Benign-appearing, observe ?- Recommend daily broad spectrum sunscreen SPF 30+ to sun-exposed areas, reapply every 2 hours as needed. ?- Call for any changes ? ?Seborrheic Keratoses ?- Stuck-on, waxy, tan-brown papules and/or plaques  ?- Benign-appearing ?- Discussed benign etiology and prognosis. ?- Observe ?- Call for any changes ? ?Melanocytic Nevi ?- Tan-brown and/or pink-flesh-colored symmetric macules and papules ?- Benign appearing on exam today ?- Observation ?- Call clinic for new or changing moles ?- Recommend daily use of broad spectrum spf 30+ sunscreen to sun-exposed areas.  ? ?Hemangiomas ?- Red papules ?- Discussed benign nature ?- Observe ?- Call for any changes ? ?Actinic Damage ?- Chronic condition, secondary to cumulative UV/sun exposure ?- diffuse scaly erythematous macules with underlying dyspigmentation ?- Recommend daily broad spectrum sunscreen SPF 30+ to sun-exposed areas, reapply every 2 hours as needed.  ?- Staying in the shade or wearing long sleeves, sun glasses (UVA+UVB protection) and wide brim hats (4-inch brim around the entire circumference of the hat) are also recommended for sun protection.  ?- Call for new or changing lesions. ? ?Dermatofibroma ?Left medial ankle ?- Firm pink/brown papulenodule with dimple sign ?- Benign appearing ?- Call for any changes  ? ?Purpura - Chronic; persistent and recurrent.  Treatable, but not curable. ?Arms  ?- Violaceous macules and patches ?- Benign ?- Related to trauma, age, sun damage and/or use of blood thinners, chronic use of topical and/or oral steroids ?- Observe ?- Can use OTC arnica containing moisturizer such as Dermend Bruise Formula if desired ?- Call for worsening or other concerns  ? ?Skin cancer screening performed today.  ? ?Return if symptoms worsen or fail to improve. ? ?I, Marye Round, CMA, am acting as  scribe for Sarina Ser, MD .  ?Documentation: I have reviewed the above documentation for accuracy and completeness, and I agree with the above. ? ?Sarina Ser, MD ? ?

## 2021-07-01 ENCOUNTER — Encounter: Payer: Self-pay | Admitting: Dermatology

## 2021-07-01 ENCOUNTER — Telehealth: Payer: Self-pay

## 2021-07-01 DIAGNOSIS — C44212 Basal cell carcinoma of skin of right ear and external auricular canal: Secondary | ICD-10-CM

## 2021-07-01 DIAGNOSIS — C44219 Basal cell carcinoma of skin of left ear and external auricular canal: Secondary | ICD-10-CM

## 2021-07-01 NOTE — Telephone Encounter (Signed)
-----   Message from Ralene Bathe, MD sent at 06/30/2021  6:22 PM EDT ----- ?Diagnosis ?Skin , left ear sup ant tragus ?BASAL CELL CARCINOMA, NODULAR AND INFILTRATIVE PATTERNS, BASE INVOLVED ? ?Cancer - BCC ?Schedule for MOHS ?

## 2021-07-01 NOTE — Telephone Encounter (Signed)
Left message on voicemail to return my call.  

## 2021-07-01 NOTE — Telephone Encounter (Signed)
Patient informed of pathology results. Referral e-mailed to Dr. Geralynn Ochs office.  ?

## 2021-07-08 NOTE — Telephone Encounter (Signed)
Patient called into the office again today. Went over pathology results in detail.   I did advise patient to make [address] 1 year TBSE recheck due to hx of BCC. Patient will call back later to schedule. aw

## 2021-07-09 ENCOUNTER — Ambulatory Visit (INDEPENDENT_AMBULATORY_CARE_PROVIDER_SITE_OTHER): Payer: Medicare Other | Admitting: Family

## 2021-07-09 ENCOUNTER — Ambulatory Visit (INDEPENDENT_AMBULATORY_CARE_PROVIDER_SITE_OTHER): Payer: Medicare Other

## 2021-07-09 ENCOUNTER — Encounter: Payer: Self-pay | Admitting: Family

## 2021-07-09 VITALS — BP 141/70 | HR 68 | Temp 98.0°F | Ht 67.0 in | Wt 169.6 lb

## 2021-07-09 DIAGNOSIS — M5442 Lumbago with sciatica, left side: Secondary | ICD-10-CM | POA: Diagnosis not present

## 2021-07-09 DIAGNOSIS — I10 Essential (primary) hypertension: Secondary | ICD-10-CM

## 2021-07-09 DIAGNOSIS — M25511 Pain in right shoulder: Secondary | ICD-10-CM

## 2021-07-09 DIAGNOSIS — R7301 Impaired fasting glucose: Secondary | ICD-10-CM | POA: Diagnosis not present

## 2021-07-09 DIAGNOSIS — G8929 Other chronic pain: Secondary | ICD-10-CM

## 2021-07-09 DIAGNOSIS — M5441 Lumbago with sciatica, right side: Secondary | ICD-10-CM

## 2021-07-09 LAB — POCT GLYCOSYLATED HEMOGLOBIN (HGB A1C): Hemoglobin A1C: 5.9 % — AB (ref 4.0–5.6)

## 2021-07-09 MED ORDER — GABAPENTIN 100 MG PO CAPS: ORAL_CAPSULE | ORAL | 3 refills | Status: DC

## 2021-07-09 MED ORDER — PREDNISONE 10 MG PO TABS: ORAL_TABLET | ORAL | 0 refills | Status: DC

## 2021-07-09 NOTE — Assessment & Plan Note (Signed)
Elevated today.  Suspect this may be related to chronic NSAID use and acute shoulder pain.  Advised him to stop Aleve.  Bystolic 2.5 mg, amlodipine 10 mg

## 2021-07-09 NOTE — Patient Instructions (Signed)
Start prednisone taper tomorrow as prednisone can interfere with sleep.  Prednisone  can make you more hungry , more irritable.    I also changed gabapentin may take 100 mg in the morning, 100 mg midday and 300 mg at bedtime to see if this is more helpful for pain and aids in sleeping  If no improvement, would recommend consult with orthopedic.  We can bring you a CD of x-ray

## 2021-07-09 NOTE — Progress Notes (Signed)
Subjective:    Patient ID: Jeffrey Yang, male    DOB: 1956-01-05, 66 y.o.   MRN: [id number]  CC: Jeffrey Yang is a 66 y.o. male who presents today for follow up.   HPI: Complains of right posterior shoulder pain, below shoulder pain, x 5 weeks, constant. Radiating to right side of neck.  Worse when holding 3 yrs and 8 month grandchildren whom he cares for daily. No pain before or after meal.  Chronic posterior neck pain.  Tingling intermittent in fingers and palm of hand.  No injury.  No HA, vision changes, rash , edema, weakness.  No relief with gabapentin Aleve BID with some relief.  He has not been using meloxicam   right handed HTN-compliant with Bystolic 2.5 mg, amlodipine 10 mg Chronic pain-compliant gabapentin 100 mg 3 times daily.  He does not think this is particularly helpful.  It is not sedating for him Depression-compliant with Cymbalta 60 mg, trazodone '50mg'$  which is working well for him.  HISTORY:  Past Medical History:  Diagnosis Date   Arthritis    Depression    GERD (gastroesophageal reflux disease)    Hypertension    Polyp of colon    Past Surgical History:  Procedure Laterality Date   BACK SURGERY     COLONOSCOPY WITH PROPOFOL N/A 12/13/2020   Procedure: COLONOSCOPY WITH PROPOFOL;  Surgeon: Jeffrey Bellows, MD;  Location: [facility] ENDOSCOPY;  Service: Gastroenterology;  Laterality: N/A;   LUMBAR SPINE SURGERY  1990   Dr Jeffrey Yang neurosurgery   LUMBAR SPINE SURGERY  2001   Dr Jeffrey Yang   NECK SURGERY  1994, 1997, 2001   plate and 2 screws in neck; 2001 cerical fusion Dr Jeffrey Yang   OTHER SURGICAL HISTORY     right knee Right 1999   meniscal repair, arthoscopic   TONSILLECTOMY  1965   Family History  Problem Relation Age of Onset   Hypertension Father    Brain cancer Father 61   COPD Mother 48   Prostate cancer Brother    Prostate cancer Brother    Heart attack Neg Hx     Allergies: Codeine Current Outpatient Medications on File Prior to Visit   Medication Sig Dispense Refill   amLODipine (NORVASC) 10 MG tablet Take 1 tablet (10 mg total) by mouth daily. 90 tablet 3   cyclobenzaprine (FLEXERIL) 10 MG tablet TAKE 1/2 TABLET BY MOUTH 3 TIMES DAILY AS NEEDED FOR MUSCLE SPASMS. 30 tablet 1   DULoxetine (CYMBALTA) 30 MG capsule TAKE 2 CAPSULES BY MOUTH EVERY DAY 180 capsule 3   meloxicam (MOBIC) 15 MG tablet TAKE 1 TABLET BY MOUTH EVERY DAY AS NEEDED FOR PAIN 30 tablet 1   nebivolol (BYSTOLIC) 5 MG tablet Take 0.5 tablets (2.5 mg total) by mouth daily. 90 tablet 1   rosuvastatin (CRESTOR) 10 MG tablet TAKE 1 TABLET BY MOUTH EVERY DAY 90 tablet 3   traZODone (DESYREL) 50 MG tablet TAKE 1/2 TO 1 TABLET BY MOUTH AT BEDTIME AS NEEDED FOR SLEEP 90 tablet 1   triamcinolone ointment (KENALOG) 0.5 % Apply 1 application topically 2 (two) times daily. 30 g 0   No current facility-administered medications on file prior to visit.    Social History   Tobacco Use   Smoking status: Never   Smokeless tobacco: Never  Vaping Use   Vaping Use: Never used  Substance Use Topics   Alcohol use: Yes   Drug use: Never    Review of Systems  Constitutional:  Negative for chills and fever.  Respiratory:  Negative for cough.   Cardiovascular:  Negative for chest pain and palpitations.  Gastrointestinal:  Negative for abdominal pain, nausea and vomiting.  Musculoskeletal:  Positive for arthralgias (right shoulder) and neck pain (chronic). Negative for back pain and joint swelling.     Objective:    BP (!) 141/70 (BP Location: Right Arm, Patient Position: Sitting, Cuff Size: Normal)   Pulse 68   Temp 98 F (36.7 C) (Oral)   Ht '5\' 7"'$  (1.702 m)   Wt 169 lb 9.6 oz (76.9 kg)   SpO2 95%   BMI 26.56 kg/m  BP Readings from Last 3 Encounters:  07/09/21 (!) 141/70  03/18/21 (!) 148/75  02/24/21 112/62   Wt Readings from Last 3 Encounters:  07/09/21 169 lb 9.6 oz (76.9 kg)  05/08/21 165 lb (74.8 kg)  03/18/21 169 lb (76.7 kg)    Physical  Exam Vitals reviewed.  Constitutional:      Appearance: Normal appearance. He is well-developed.  Cardiovascular:     Rate and Rhythm: Regular rhythm.     Heart sounds: Normal heart sounds.  Pulmonary:     Effort: Pulmonary effort is normal. No respiratory distress.     Breath sounds: Normal breath sounds. No wheezing, rhonchi or rales.  Abdominal:     General: Bowel sounds are normal. There is no distension.     Palpations: Abdomen is soft. Abdomen is not rigid. There is no fluid wave or mass.     Tenderness: There is no abdominal tenderness. There is no guarding or rebound. Negative signs include Murphy's sign.  Musculoskeletal:     Right shoulder: Tenderness present. No swelling or bony tenderness. Decreased range of motion. Normal strength.     Comments: Right Shoulder:   No asymmetry of shoulders when comparing right and left. Pain with palpation over glenohumeral joint lines. No pain over [place] joint, AC joint, or bicipital groove. No pain with internal and external rotation. Slight pain with resisted lateral extension and reduced rom with lateral arm raise.   Negative active painful arc sign. Negative passive arc ( Neer's). Negative drop arm.   Strength and sensation normal BUE's.   Skin:    General: Skin is warm and dry.  Neurological:     Mental Status: He is alert.  Psychiatric:        Speech: Speech normal.        Behavior: Behavior normal.       Assessment & Plan:   Problem List Items Addressed This Visit       Cardiovascular and Mediastinum   Essential hypertension    Elevated today.  Suspect this may be related to chronic NSAID use and acute shoulder pain.  Advised him to stop Aleve.  Bystolic 2.5 mg, amlodipine 10 mg         Other   Low back pain   Relevant Medications   predniSONE (DELTASONE) 10 MG tablet   gabapentin (NEURONTIN) 100 MG capsule   Right shoulder pain - Primary    Exam consistent with impingement syndrome and/or rotator cuff pathology.   Symptoms not exacerbated by eating.  Negative Murphy sign .we did discuss he does a lot of lifting with his young grandchildren.  Pending x-ray.  Advised him to start prednisone taper and will increase gabapentin to 100 every morning,'100mg'$  midday and 300 every afternoon. Stop aleve.  He will let me know how he is doing and if no resolution of symptoms will  advise consult with orthopedic       Relevant Medications   predniSONE (DELTASONE) 10 MG tablet   gabapentin (NEURONTIN) 100 MG capsule   Other Relevant Orders   DG Shoulder Right   Other Visit Diagnoses     Impaired fasting glucose       Relevant Orders   POCT HgB A1C        I have changed Danne L. Grajeda's gabapentin. I am also having him start on predniSONE. Additionally, I am having him maintain his cyclobenzaprine, nebivolol, meloxicam, triamcinolone ointment, amLODipine, traZODone, DULoxetine, and rosuvastatin.   Meds ordered this encounter  Medications   predniSONE (DELTASONE) 10 MG tablet    Sig: Take 4 tablets ( total 40 mg) by mouth for 2 days; take 3 tablets ( total 30 mg) by mouth for 2 days; take 2 tablets ( total 20 mg) by mouth for 1 day; take 1 tablet ( total 10 mg) by mouth for 1 day.    Dispense:  17 tablet    Refill:  0    Order Specific Question:   Supervising Provider    Answer:   Deborra Medina L [[id number]]   gabapentin (NEURONTIN) 100 MG capsule    Sig: Take '100mg'$  qam, 100 mg midday and '300mg'$  qhs.    Dispense:  150 capsule    Refill:  3    Order Specific Question:   Supervising Provider    Answer:   Crecencio Mc [[id number]]    Return precautions given.   Risks, benefits, and alternatives of the medications and treatment plan prescribed today were discussed, and patient expressed understanding.   Education regarding symptom management and diagnosis given to patient on AVS.  Continue to follow with Burnard Hawthorne, FNP for routine health maintenance.   [name] and I agreed with plan.   Mable Paris, FNP

## 2021-07-09 NOTE — Assessment & Plan Note (Addendum)
Exam consistent with impingement syndrome and/or rotator cuff pathology.  Symptoms not exacerbated by eating.  Negative Murphy sign .we did discuss he does a lot of lifting with his young grandchildren.  Pending x-ray.  Advised him to start prednisone taper and will increase gabapentin to 100 every morning,'100mg'$  midday and 300 every afternoon. Stop aleve.  He will let me know how he is doing and if no resolution of symptoms will advise consult with orthopedic

## 2021-08-02 ENCOUNTER — Other Ambulatory Visit: Payer: Self-pay | Admitting: Family

## 2021-08-02 DIAGNOSIS — I10 Essential (primary) hypertension: Secondary | ICD-10-CM

## 2021-08-18 DIAGNOSIS — C44219 Basal cell carcinoma of skin of left ear and external auricular canal: Secondary | ICD-10-CM | POA: Diagnosis not present

## 2021-08-25 ENCOUNTER — Encounter: Payer: Self-pay | Admitting: Family

## 2021-08-25 ENCOUNTER — Ambulatory Visit (INDEPENDENT_AMBULATORY_CARE_PROVIDER_SITE_OTHER): Payer: Medicare Other | Admitting: Family

## 2021-08-25 VITALS — BP 124/78 | HR 59 | Temp 98.0°F | Ht 66.0 in | Wt 169.8 lb

## 2021-08-25 DIAGNOSIS — M25561 Pain in right knee: Secondary | ICD-10-CM | POA: Diagnosis not present

## 2021-08-25 DIAGNOSIS — G8929 Other chronic pain: Secondary | ICD-10-CM | POA: Diagnosis not present

## 2021-08-25 DIAGNOSIS — I10 Essential (primary) hypertension: Secondary | ICD-10-CM | POA: Diagnosis not present

## 2021-08-25 DIAGNOSIS — M25511 Pain in right shoulder: Secondary | ICD-10-CM

## 2021-08-25 LAB — C-REACTIVE PROTEIN: CRP: <1 mg/dL (ref 0.5–20.0)

## 2021-08-25 LAB — URIC ACID: Uric Acid, Serum: 5.2 mg/dL (ref 4.0–7.8)

## 2021-08-25 NOTE — Assessment & Plan Note (Addendum)
Reviewed previous x-ray right knee 07/2020 as it relates to effusion, degenerative changes.  Pending uric acid, inflammatory markers.  I do not anticipate rheumatoid arthritis.  Counseled him on the importance of icing 20 minutes 2-3 times daily, Ace wrap or neoprene sleeve.  Advised him of the importance to use NSAIDs as needed versus daily.  Counseled him that he may use Tylenol arthritis.  He plight declines referral orthopedics at this time.  He will let me know if he reconsiders

## 2021-08-25 NOTE — Progress Notes (Signed)
Patient spoke of knee trouble thinking it may be arthritis

## 2021-08-25 NOTE — Assessment & Plan Note (Signed)
Chronic, stable, improved.  Continue gabapentin to 100 every morning,'100mg'$  midday and 300 every afternoon, cymbalta 60 mg daily

## 2021-08-25 NOTE — Patient Instructions (Signed)
As discussed, let's start by scheduling Tylenol Arthritis which is a 650mg tablet .  ? ?You may take 1-2 tablets every 8 hours ( scheduled) with maximum of 6 tablets per day.  ? ?For example , you could take two tablets in the morning ( 8am) and then two tablets again at 4pm.  ? ?Maximum daily dose of acetaminophen 4 g per day from all sources.  If you are taking another medication which includes acetaminophen (Tylenol) which may be in cough and cold preparations or pain medication such as Percocet, you will need to factor that into your total daily dose to be safe.  Please let me know if any questions ? ?

## 2021-08-25 NOTE — Assessment & Plan Note (Signed)
Chronic, improved.  Continue Bystolic 2.5 mg, amlodipine 10 mg.  Counseled him as it relates to NSAIDs and how they affect/raise blood pressure

## 2021-08-25 NOTE — Assessment & Plan Note (Deleted)
Reviewed previous x-ray as it relates to effusion, degenerative changes.  Pending uric acid, inflammatory markers.  I do not anticipate rheumatoid arthritis.  Counseled him on the importance of icing 20 minutes 2-3 times daily, Ace wrap or neoprene sleeve.  Advised him of the importance to use NSAIDs as needed and moderately.  Counseled him that he may use Tylenol arthritis.  He plight declines referral orthopedics at this time.  He will let me know if he reconsiders

## 2021-08-25 NOTE — Progress Notes (Signed)
Subjective:    Patient ID: Jeffrey Yang, male    DOB: 1956-01-28, 66 y.o.   MRN: [id number]  CC: Jeffrey Yang is a 66 y.o. male who presents today for follow up.   HPI: Overall feels well today.  No new complaints.  His right knee continues to bother him from time to time.  He describes stiffness particularly as he gets on and off the floor with his grandchildren whom he watches daily.  No knee swelling.  He takes occasional meloxicam or Advil.  No wrist or hand pain.  HTN- compliant with Bystolic 2.5 mg, amlodipine 10 mg Right shoulder pain- improved after prednisone. compliant with gabapentin to 100 every morning,'100mg'$  midday and 300 every afternoon.  He is also compliant with Cymbalta 60 mg  daily.  He does not find gabapentin to be sedating  HISTORY:  Past Medical History:  Diagnosis Date   Arthritis    Depression    GERD (gastroesophageal reflux disease)    Hypertension    Polyp of colon    Past Surgical History:  Procedure Laterality Date   BACK SURGERY     COLONOSCOPY WITH PROPOFOL N/A 12/13/2020   Procedure: COLONOSCOPY WITH PROPOFOL;  Surgeon: Jonathon Bellows, MD;  Location: [facility] ENDOSCOPY;  Service: Gastroenterology;  Laterality: N/A;   LUMBAR SPINE SURGERY  1990   Dr Quentin Cornwall neurosurgery   LUMBAR SPINE SURGERY  2001   Dr Astrid Drafts   NECK SURGERY  1994, 1997, 2001   plate and 2 screws in neck; 2001 cerical fusion Dr Doristine Counter   OTHER SURGICAL HISTORY     right knee Right 1999   meniscal repair, arthoscopic   TONSILLECTOMY  1965   Family History  Problem Relation Age of Onset   Hypertension Father    Brain cancer Father 17   COPD Mother 46   Prostate cancer Brother    Prostate cancer Brother    Heart attack Neg Hx     Allergies: Codeine Current Outpatient Medications on File Prior to Visit  Medication Sig Dispense Refill   amLODipine (NORVASC) 10 MG tablet Take 1 tablet (10 mg total) by mouth daily. 90 tablet 3   cyclobenzaprine (FLEXERIL) 10 MG tablet TAKE  1/2 TABLET BY MOUTH 3 TIMES DAILY AS NEEDED FOR MUSCLE SPASMS. 30 tablet 1   DULoxetine (CYMBALTA) 30 MG capsule TAKE 2 CAPSULES BY MOUTH EVERY DAY 180 capsule 3   gabapentin (NEURONTIN) 100 MG capsule Take '100mg'$  qam, 100 mg midday and '300mg'$  qhs. 150 capsule 3   meloxicam (MOBIC) 15 MG tablet TAKE 1 TABLET BY MOUTH EVERY DAY AS NEEDED FOR PAIN 30 tablet 1   nebivolol (BYSTOLIC) 5 MG tablet TAKE 1/2 TABLET BY MOUTH DAILY 45 tablet 3   rosuvastatin (CRESTOR) 10 MG tablet TAKE 1 TABLET BY MOUTH EVERY DAY 90 tablet 3   traZODone (DESYREL) 50 MG tablet TAKE 1/2 TO 1 TABLET BY MOUTH AT BEDTIME AS NEEDED FOR SLEEP 90 tablet 1   triamcinolone ointment (KENALOG) 0.5 % Apply 1 application topically 2 (two) times daily. 30 g 0   No current facility-administered medications on file prior to visit.    Social History   Tobacco Use   Smoking status: Never   Smokeless tobacco: Never  Vaping Use   Vaping Use: Never used  Substance Use Topics   Alcohol use: Yes   Drug use: Never    Review of Systems  Constitutional:  Negative for chills and fever.  Respiratory:  Negative for cough.  Cardiovascular:  Negative for chest pain and palpitations.  Gastrointestinal:  Negative for nausea and vomiting.  Musculoskeletal:  Positive for arthralgias. Negative for joint swelling.      Objective:    BP 124/78 (BP Location: Left Arm, Patient Position: Sitting, Cuff Size: Normal)   Pulse (!) 59   Temp 98 F (36.7 C) (Oral)   Ht '5\' 6"'$  (1.676 m)   Wt 169 lb 12.8 oz (77 kg)   SpO2 96%   BMI 27.41 kg/m  BP Readings from Last 3 Encounters:  08/25/21 124/78  07/09/21 (!) 141/70  03/18/21 (!) 148/75   Wt Readings from Last 3 Encounters:  08/25/21 169 lb 12.8 oz (77 kg)  07/09/21 169 lb 9.6 oz (76.9 kg)  05/08/21 165 lb (74.8 kg)    Physical Exam Vitals reviewed.  Constitutional:      Appearance: He is well-developed.  Cardiovascular:     Rate and Rhythm: Regular rhythm.     Heart sounds: Normal  heart sounds.  Pulmonary:     Effort: Pulmonary effort is normal. No respiratory distress.     Breath sounds: Normal breath sounds. No wheezing, rhonchi or rales.  Musculoskeletal:     Right knee: No swelling. Normal range of motion. No tenderness.     Left knee: No swelling. Normal range of motion. No tenderness.     Comments: Bilateral knees are symmetric. No effusion appreciated. No increase in warmth or erythema.  Right knee:  Able to extend to -5 to 10 degrees and flex to 110 degrees. No catching with McMurray maneuver. No patellar apprehension. Negative anterior drawer and lachman's- no laxity appreciated.  No calf tenderness of lower leg edema bilaterally.    Skin:    General: Skin is warm and dry.  Neurological:     Mental Status: He is alert.  Psychiatric:        Speech: Speech normal.        Behavior: Behavior normal.        Assessment & Plan:   Problem List Items Addressed This Visit       Cardiovascular and Mediastinum   Essential hypertension    Chronic, improved.  Continue Bystolic 2.5 mg, amlodipine 10 mg.  Counseled him as it relates to NSAIDs and how they affect/raise blood pressure        Other   Right knee pain - Primary    Reviewed previous x-ray right knee 07/2020 as it relates to effusion, degenerative changes.  Pending uric acid, inflammatory markers.  I do not anticipate rheumatoid arthritis.  Counseled him on the importance of icing 20 minutes 2-3 times daily, Ace wrap or neoprene sleeve.  Advised him of the importance to use NSAIDs as needed versus daily.  Counseled him that he may use Tylenol arthritis.  He plight declines referral orthopedics at this time.  He will let me know if he reconsiders      Relevant Orders   ANA   C-reactive protein   CYCLIC CITRUL PEPTIDE ANTIBODY, IGG/IGA   Rheumatoid factor   Uric acid   Right shoulder pain    Chronic, stable, improved.  Continue gabapentin to 100 every morning,'100mg'$  midday and 300 every afternoon,  cymbalta 60 mg daily        I have discontinued Antony Haste L. Hoque's predniSONE. I am also having him maintain his cyclobenzaprine, meloxicam, triamcinolone ointment, amLODipine, traZODone, DULoxetine, rosuvastatin, gabapentin, and nebivolol.   No orders of the defined types were placed in this encounter.  Return precautions given.   Risks, benefits, and alternatives of the medications and treatment plan prescribed today were discussed, and patient expressed understanding.   Education regarding symptom management and diagnosis given to patient on AVS.  Continue to follow with Burnard Hawthorne, FNP for routine health maintenance.   [name] and I agreed with plan.   Mable Paris, FNP

## 2021-08-26 ENCOUNTER — Telehealth: Payer: Self-pay

## 2021-08-26 LAB — RHEUMATOID FACTOR: Rheumatoid fact SerPl-aCnc: <14 IU/mL (ref <14)

## 2021-08-26 LAB — ANA: Anti Nuclear Antibody (ANA): NEGATIVE

## 2021-08-26 NOTE — Telephone Encounter (Signed)
Updated specimen tracking and history from Mohs photos.

## 2021-08-27 LAB — CYCLIC CITRUL PEPTIDE ANTIBODY, IGG/IGA: Cyclic Citrullin Peptide Ab: 4 units (ref 0–19)

## 2021-10-18 ENCOUNTER — Other Ambulatory Visit: Payer: Self-pay | Admitting: Family

## 2021-10-18 DIAGNOSIS — G4700 Insomnia, unspecified: Secondary | ICD-10-CM

## 2021-12-13 IMAGING — DX DG KNEE COMPLETE 4+V*R*
5 series · 5 of 5 positions shown · non-contrast
Comparison: None.

CLINICAL DATA: Knee pain.

EXAM:
RIGHT KNEE - COMPLETE 4+ VIEW

[knee standing ap]
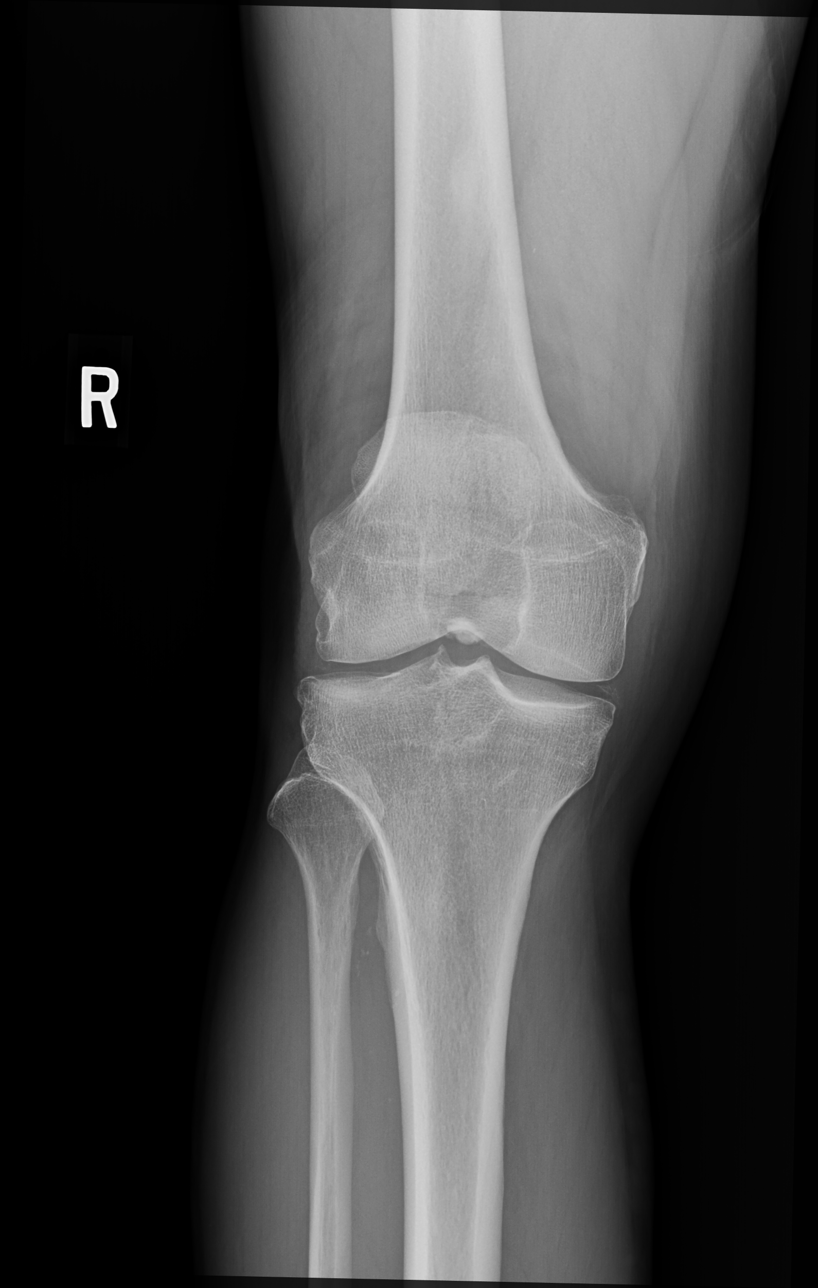

[knee standing external ap]
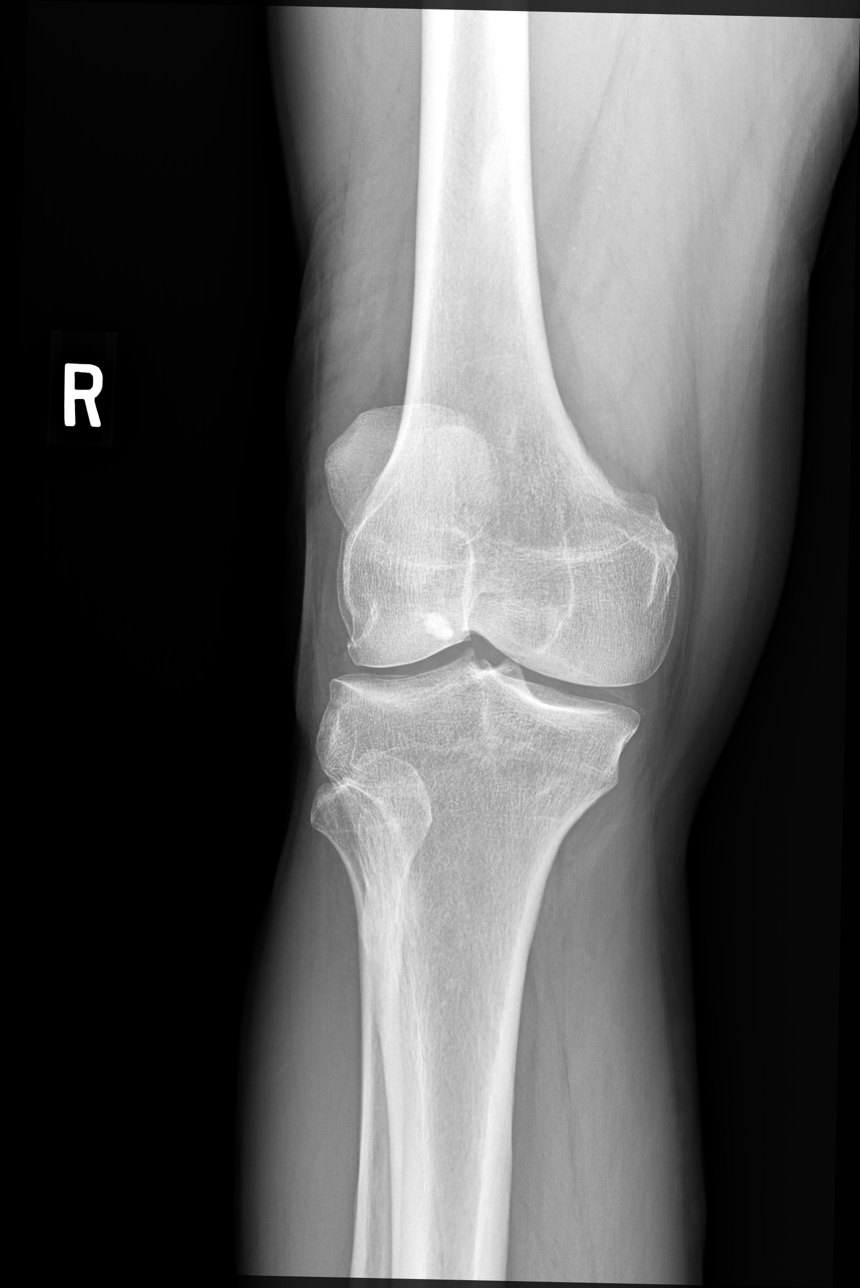

[knee standing internal ap]
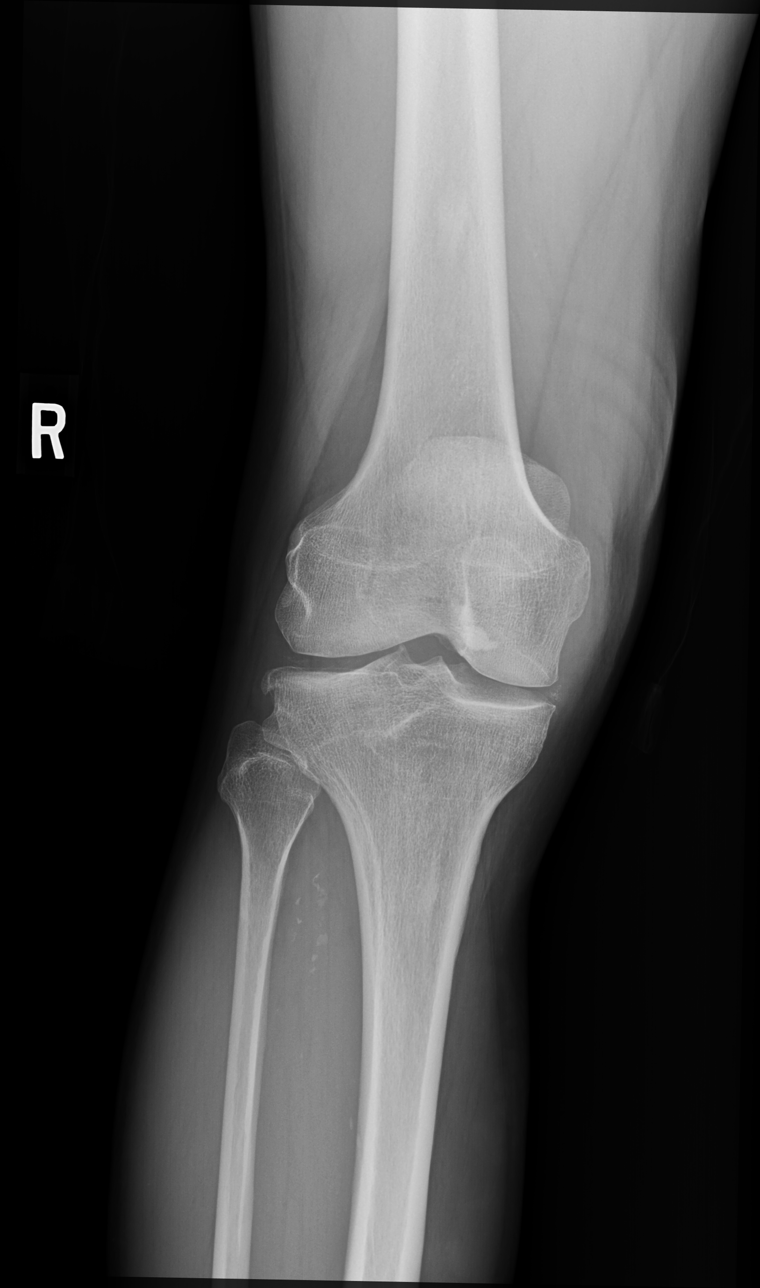

[knee standing lat]
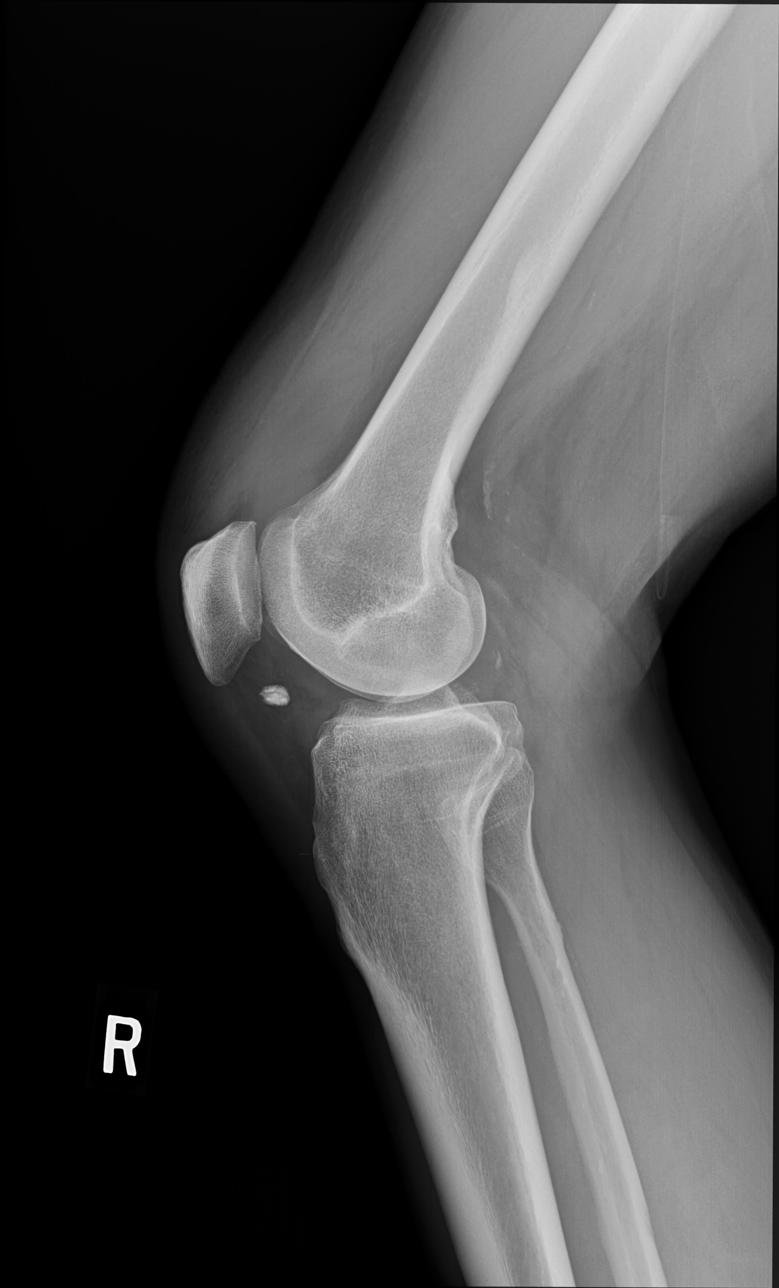

[knee [person_name] view pa]
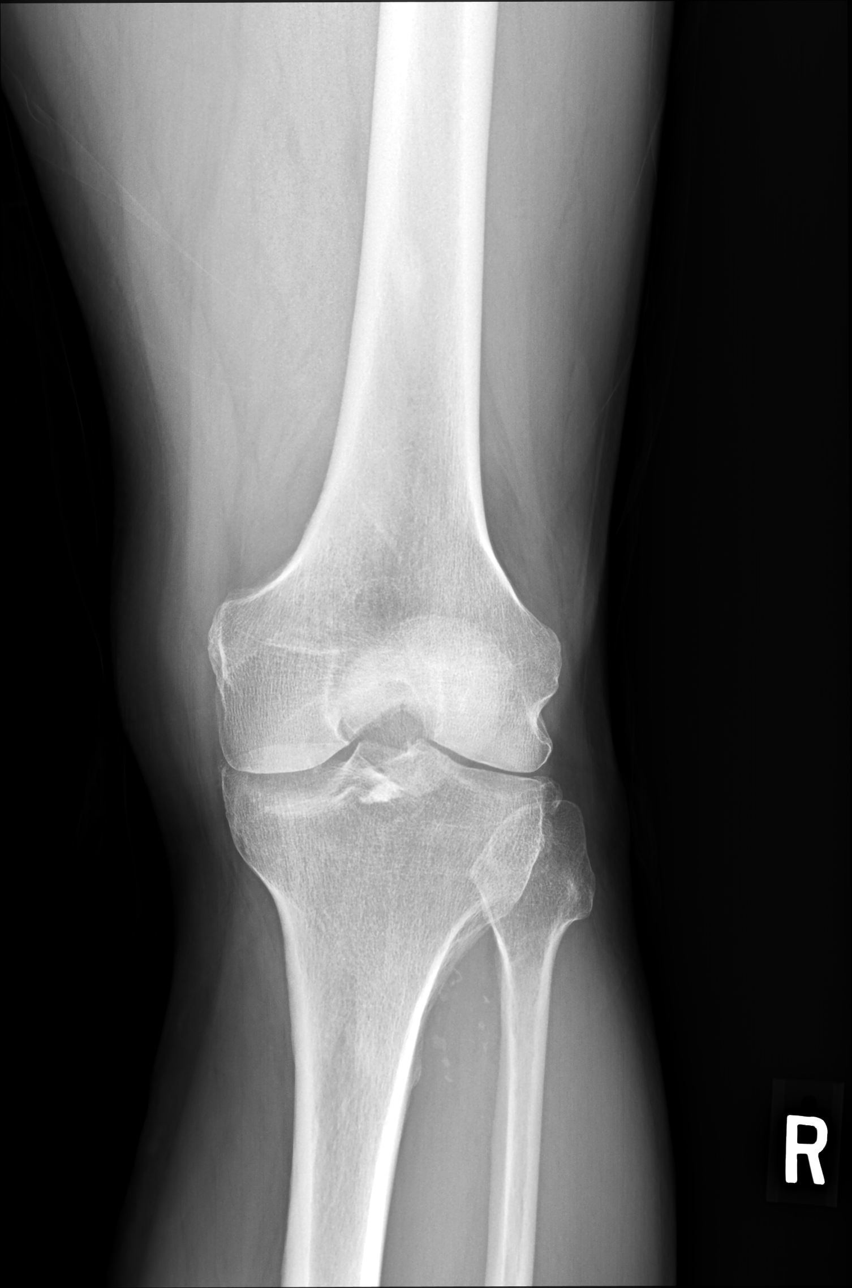

[5 of 5 positions shown; findings below may reference images not displayed]

FINDINGS: Chondrocalcinosis identified primarily in the medial compartment. No
fractures. A calcification projects over [place]ilien on the lateral
view. Degenerative changes are seen primarily in the lateral
compartment. There may be a small joint effusion. Vascular
calcifications are noted. No other abnormalities.
IMPRESSION: 1. Chondrocalcinosis consistent with CPPD, most prominent in the
medial compartment.
2. The calcification projected over Hoffa's fat is of no acute
significance.
3. Degenerative changes most prominent the lateral compartment.
4. Small joint effusion.
5. Vascular calcifications.

## 2022-01-27 DIAGNOSIS — C44219 Basal cell carcinoma of skin of left ear and external auricular canal: Secondary | ICD-10-CM | POA: Diagnosis not present

## 2022-02-24 ENCOUNTER — Other Ambulatory Visit: Payer: Self-pay

## 2022-02-25 ENCOUNTER — Ambulatory Visit (INDEPENDENT_AMBULATORY_CARE_PROVIDER_SITE_OTHER): Payer: Medicare Other | Admitting: Family

## 2022-02-25 ENCOUNTER — Encounter: Payer: Self-pay | Admitting: Family

## 2022-02-25 VITALS — BP 130/78 | HR 71 | Temp 97.9°F | Ht 67.0 in | Wt 170.2 lb

## 2022-02-25 DIAGNOSIS — M25561 Pain in right knee: Secondary | ICD-10-CM

## 2022-02-25 DIAGNOSIS — G8929 Other chronic pain: Secondary | ICD-10-CM | POA: Diagnosis not present

## 2022-02-25 DIAGNOSIS — M5442 Lumbago with sciatica, left side: Secondary | ICD-10-CM

## 2022-02-25 DIAGNOSIS — M5441 Lumbago with sciatica, right side: Secondary | ICD-10-CM

## 2022-02-25 DIAGNOSIS — Z125 Encounter for screening for malignant neoplasm of prostate: Secondary | ICD-10-CM

## 2022-02-25 DIAGNOSIS — G4700 Insomnia, unspecified: Secondary | ICD-10-CM | POA: Diagnosis not present

## 2022-02-25 DIAGNOSIS — I10 Essential (primary) hypertension: Secondary | ICD-10-CM | POA: Diagnosis not present

## 2022-02-25 DIAGNOSIS — M545 Low back pain, unspecified: Secondary | ICD-10-CM

## 2022-02-25 DIAGNOSIS — F32A Depression, unspecified: Secondary | ICD-10-CM | POA: Diagnosis not present

## 2022-02-25 DIAGNOSIS — M25511 Pain in right shoulder: Secondary | ICD-10-CM

## 2022-02-25 DIAGNOSIS — Z87898 Personal history of other specified conditions: Secondary | ICD-10-CM | POA: Diagnosis not present

## 2022-02-25 DIAGNOSIS — F419 Anxiety disorder, unspecified: Secondary | ICD-10-CM

## 2022-02-25 LAB — COMPREHENSIVE METABOLIC PANEL
ALT: 16 U/L (ref 0–53)
AST: 18 U/L (ref 0–37)
Albumin: 4.6 g/dL (ref 3.5–5.2)
Alkaline Phosphatase: 106 U/L (ref 39–117)
BUN: 18 mg/dL (ref 6–23)
CO2: 25 mEq/L (ref 19–32)
Calcium: 9.9 mg/dL (ref 8.4–10.5)
Chloride: 102 mEq/L (ref 96–112)
Creatinine, Ser: 1.2 mg/dL (ref 0.40–1.50)
GFR: 63.07 mL/min (ref >60.00)
Glucose, Bld: 136 mg/dL — ABNORMAL HIGH (ref 70–99)
Potassium: 4.2 mEq/L (ref 3.5–5.1)
Sodium: 139 mEq/L (ref 135–145)
Total Bilirubin: 0.6 mg/dL (ref 0.2–1.2)
Total Protein: 7.2 g/dL (ref 6.0–8.3)

## 2022-02-25 LAB — HEMOGLOBIN A1C: Hgb A1c MFr Bld: 6 % (ref 4.6–6.5)

## 2022-02-25 LAB — PSA: PSA: 2.16 ng/mL (ref 0.10–4.00)

## 2022-02-25 MED ORDER — AMLODIPINE BESYLATE 10 MG PO TABS
10.0000 mg | ORAL_TABLET | Freq: Every day | ORAL | 3 refills | Status: DC
Start: 2022-02-25 — End: 2023-02-22

## 2022-02-25 MED ORDER — DULOXETINE HCL 60 MG PO CPEP: 60.0000 mg | ORAL_CAPSULE | Freq: Every day | ORAL | 3 refills | Status: DC

## 2022-02-25 MED ORDER — TRAZODONE HCL 50 MG PO TABS: 50.0000 mg | ORAL_TABLET | Freq: Every day | ORAL | 3 refills | Status: DC

## 2022-02-25 MED ORDER — MELOXICAM 7.5 MG PO TABS: 7.5000 mg | ORAL_TABLET | Freq: Every day | ORAL | 2 refills | Status: DC | PRN

## 2022-02-25 NOTE — Progress Notes (Signed)
Assessment & Plan:  Essential hypertension Assessment & Plan: Chronic, stable.  Continue Bystolic 2.5 mg, amlodipine 10 mg daily .    Orders: -     amLODIPine Besylate; Take 1 tablet (10 mg total) by mouth daily.  Dispense: 90 tablet; Refill: 3 -     Comprehensive metabolic panel  Acute pain of right shoulder Assessment & Plan: Chronic, stable.  Continue gabapentin 200 mg nightly.  Advised he may use meloxicam 7.5 mg as needed with food.   Chronic bilateral low back pain with bilateral sciatica  Acute pain of right knee -     Meloxicam; Take 1 tablet (7.5 mg total) by mouth daily as needed for pain.  Dispense: 30 tablet; Refill: 2  Anxiety and depression -     DULoxetine HCl; Take 1 capsule (60 mg total) by mouth daily.  Dispense: 90 capsule; Refill: 3 -     traZODone HCl; Take 1 tablet (50 mg total) by mouth at bedtime. for sleep  Dispense: 90 tablet; Refill: 3  Low back pain, unspecified back pain laterality, unspecified chronicity, unspecified whether sciatica present  Insomnia, unspecified type -     traZODone HCl; Take 1 tablet (50 mg total) by mouth at bedtime. for sleep  Dispense: 90 tablet; Refill: 3  Screening for prostate cancer -     PSA  History of prediabetes -     Hemoglobin A1c     Return precautions given.   Risks, benefits, and alternatives of the medications and treatment plan prescribed today were discussed, and patient expressed understanding.   Education regarding symptom management and diagnosis given to patient on AVS either electronically or printed.  No follow-ups on file.  Mable Paris, FNP  Subjective:    Patient ID: Jeffrey Yang, male    DOB: 03-02-1955, 67 y.o.   MRN: [id number]  CC: Jeffrey Yang is a 67 y.o. male who presents today for follow up.   HPI: Feels well today.  No new complaints.  Occasional right shoulder and back pain which is unchanged.  He uses Flexeril and meloxicam on occasion.     HTN-compliant with Bystolic  2.5 mg, amlodipine 10 mg daily .  Denies chest pain, shortness of breath.  recent diagnosis of basal cell carcinoma.  He continues to follow with dermatology.    Allergies: Codeine Current Outpatient Medications on File Prior to Visit  Medication Sig Dispense Refill   cyclobenzaprine (FLEXERIL) 10 MG tablet TAKE 1/2 TABLET BY MOUTH 3 TIMES DAILY AS NEEDED FOR MUSCLE SPASMS. 30 tablet 1   DULoxetine (CYMBALTA) 30 MG capsule TAKE 2 CAPSULES BY MOUTH EVERY DAY 180 capsule 3   gabapentin (NEURONTIN) 100 MG capsule Take '100mg'$  qam, 100 mg midday and '300mg'$  qhs. 150 capsule 3   nebivolol (BYSTOLIC) 5 MG tablet TAKE 1/2 TABLET BY MOUTH DAILY 45 tablet 3   rosuvastatin (CRESTOR) 10 MG tablet TAKE 1 TABLET BY MOUTH EVERY DAY 90 tablet 3   triamcinolone ointment (KENALOG) 0.5 % Apply 1 application topically 2 (two) times daily. 30 g 0   No current facility-administered medications on file prior to visit.    Review of Systems  Constitutional:  Negative for chills and fever.  Respiratory:  Negative for cough.   Cardiovascular:  Negative for chest pain and palpitations.  Gastrointestinal:  Negative for nausea and vomiting.      Objective:    BP 130/78   Pulse 71   Temp 97.9 F (36.6 C) (Oral)   Ht  $'5\' 7"'L$  (1.702 m)   Wt 170 lb 3.2 oz (77.2 kg)   SpO2 97%   BMI 26.66 kg/m  BP Readings from Last 3 Encounters:  02/25/22 130/78  08/25/21 124/78  07/09/21 (!) 141/70   Wt Readings from Last 3 Encounters:  02/25/22 170 lb 3.2 oz (77.2 kg)  08/25/21 169 lb 12.8 oz (77 kg)  07/09/21 169 lb 9.6 oz (76.9 kg)    Physical Exam Vitals reviewed.  Constitutional:      Appearance: He is well-developed.  Cardiovascular:     Rate and Rhythm: Regular rhythm.     Heart sounds: Normal heart sounds.  Pulmonary:     Effort: Pulmonary effort is normal. No respiratory distress.     Breath sounds: Normal breath sounds. No wheezing, rhonchi or rales.  Skin:    General: Skin is warm and dry.   Neurological:     Mental Status: He is alert.  Psychiatric:        Speech: Speech normal.        Behavior: Behavior normal.

## 2022-02-25 NOTE — Assessment & Plan Note (Signed)
Chronic, stable.  Continue Bystolic 2.5 mg, amlodipine 10 mg daily .

## 2022-02-25 NOTE — Assessment & Plan Note (Signed)
Chronic, stable.  Continue gabapentin 200 mg nightly.  Advised he may use meloxicam 7.5 mg as needed with food.

## 2022-03-24 ENCOUNTER — Ambulatory Visit: Payer: Medicare Other | Admitting: Urology

## 2022-04-20 ENCOUNTER — Other Ambulatory Visit: Payer: Self-pay | Admitting: Family

## 2022-04-20 DIAGNOSIS — G4700 Insomnia, unspecified: Secondary | ICD-10-CM

## 2022-04-20 DIAGNOSIS — F419 Anxiety disorder, unspecified: Secondary | ICD-10-CM

## 2022-04-24 ENCOUNTER — Other Ambulatory Visit: Payer: Self-pay

## 2022-04-24 DIAGNOSIS — F32A Depression, unspecified: Secondary | ICD-10-CM

## 2022-04-24 DIAGNOSIS — G4700 Insomnia, unspecified: Secondary | ICD-10-CM

## 2022-04-24 MED ORDER — TRAZODONE HCL 50 MG PO TABS: 50.0000 mg | ORAL_TABLET | Freq: Every day | ORAL | 3 refills | Status: DC

## 2022-04-27 ENCOUNTER — Telehealth: Payer: Self-pay

## 2022-04-27 DIAGNOSIS — G4700 Insomnia, unspecified: Secondary | ICD-10-CM

## 2022-04-27 DIAGNOSIS — F419 Anxiety disorder, unspecified: Secondary | ICD-10-CM

## 2022-04-27 MED ORDER — TRAZODONE HCL 50 MG PO TABS: 50.0000 mg | ORAL_TABLET | Freq: Every day | ORAL | 3 refills | Status: DC

## 2022-04-30 MED ORDER — TRAZODONE HCL 50 MG PO TABS: 50.0000 mg | ORAL_TABLET | Freq: Every day | ORAL | 3 refills | Status: DC

## 2022-04-30 NOTE — Addendum Note (Signed)
Addended by: [place], Kostas Marrow on: 04/30/2022 10:26 AM   Modules accepted: Orders

## 2022-04-30 NOTE — Telephone Encounter (Signed)
Pt need a refill on trazodone sent to [facility]

## 2022-05-01 MED ORDER — TRAZODONE HCL 50 MG PO TABS: 50.0000 mg | ORAL_TABLET | Freq: Every day | ORAL | 3 refills | Status: DC

## 2022-05-01 NOTE — Addendum Note (Signed)
Addended by: [place], Brinly Maietta on: 05/01/2022 09:12 AM   Modules accepted: Orders

## 2022-05-01 NOTE — Telephone Encounter (Signed)
Pt spouse called in staying that Jeffrey Yang still don't have pt med. As per pt it was not approved by prescriber, pt do not understand what that means. Pt needs his med for the weekend.

## 2022-05-12 ENCOUNTER — Telehealth: Payer: Self-pay | Admitting: Family

## 2022-05-12 NOTE — Telephone Encounter (Signed)
Copied from [place] [id number]. Topic: Medicare AWV >> May 12, 2022  3:00 PM Lollie Marrow wrote: Reason for CRM: Called patient to schedule Medicare Annual Wellness Visit (AWV). Left message for patient to call back and schedule Medicare Annual Wellness Visit (AWV).  Last date of AWV: 05/08/2021  Please schedule an AWVS appointment at any time with [place].  If any questions, please contact me at [phone].   Thank you,  [place] Direct dial  [phone]

## 2022-06-25 ENCOUNTER — Telehealth: Payer: Self-pay | Admitting: Family

## 2022-06-25 NOTE — Telephone Encounter (Signed)
Copied from CRM [phone]. Topic: Medicare AWV >> Jun 25, 2022  1:33 PM Payton Doughty wrote: Reason for CRM: Called patient to schedule Medicare Annual Wellness Visit (AWV). Left message for patient to call back and schedule Medicare Annual Wellness Visit (AWV).  Last date of AWV: 05/08/21  Please schedule an appointment at any time with Annabell Sabal, CMA  .  If any questions, please contact me.  Thank you ,  Verlee Rossetti; Care Guide Ambulatory Clinical Support [place] [facility] Direct Dial: [phone]

## 2022-06-29 ENCOUNTER — Telehealth: Payer: Self-pay | Admitting: Family

## 2022-06-29 DIAGNOSIS — G8929 Other chronic pain: Secondary | ICD-10-CM

## 2022-06-29 MED ORDER — CYCLOBENZAPRINE HCL 10 MG PO TABS: ORAL_TABLET | ORAL | 1 refills | Status: DC

## 2022-06-29 NOTE — Telephone Encounter (Signed)
Prescription Request  06/29/2022  LOV: 02/25/2022  What is the name of the medication or equipment?  cyclobenzaprine (FLEXERIL) 10 MG tablet   Have you contacted your pharmacy to request a refill? Yes   Which pharmacy would you like this sent to?  [id number] [place], [place] - [address] [place] [address] Phone: [phone] Fax: [phone]    Patient notified that their request is being sent to the clinical staff for review and that they should receive a response within 2 business days.   Please advise at [facility] [phone]

## 2022-06-29 NOTE — Telephone Encounter (Signed)
Rx sent in to pharmacy pt is aware

## 2022-06-30 ENCOUNTER — Ambulatory Visit (INDEPENDENT_AMBULATORY_CARE_PROVIDER_SITE_OTHER): Payer: Medicare Other

## 2022-06-30 VITALS — Wt 170.0 lb

## 2022-06-30 DIAGNOSIS — Z0000 Encounter for general adult medical examination without abnormal findings: Secondary | ICD-10-CM

## 2022-06-30 NOTE — Progress Notes (Signed)
Subjective:   Jeffrey Yang is a 67 y.o. male who presents for Medicare Annual/Subsequent preventive examination.  Review of Systems    I connected with  Alfonzo Beers on 06/30/22 by a audio enabled telemedicine application and verified that I am speaking with the correct person using two identifiers.  Patient Medicare AWV questionnaire was completed by the patient on 06/30/22; I have confirmed that all information answered by patient is correct and no changes since this date.     Patient Location: Home  Provider Location: Home Office  I discussed the limitations of evaluation and management by telemedicine. The patient expressed understanding and agreed to proceed.  Cardiac Risk Factors include: hypertension     Objective:    Today's Vitals   06/30/22 1254 06/30/22 1255  Weight: 170 lb (77.1 kg)   PainSc:  5    Body mass index is 26.63 kg/m.     06/30/2022    1:05 PM 05/08/2021    8:26 AM 12/13/2020   10:08 AM 08/11/2019   12:38 PM  Advanced Directives  Does Patient Have a Medical Advance Directive? No No No No  Would patient like information on creating a medical advance directive? No - Patient declined No - Patient declined  No - Patient declined    Current Medications (verified) Outpatient Encounter Medications as of 06/30/2022  Medication Sig   amLODipine (NORVASC) 10 MG tablet Take 1 tablet (10 mg total) by mouth daily.   cyclobenzaprine (FLEXERIL) 10 MG tablet Take 1/2 tablet by mouth 3 times a day as needed for muscles spasm   DULoxetine (CYMBALTA) 60 MG capsule Take 1 capsule (60 mg total) by mouth daily.   nebivolol (BYSTOLIC) 5 MG tablet TAKE 1/2 TABLET BY MOUTH DAILY   rosuvastatin (CRESTOR) 10 MG tablet TAKE 1 TABLET BY MOUTH EVERY DAY   traZODone (DESYREL) 50 MG tablet Take 1 tablet (50 mg total) by mouth at bedtime. for sleep   triamcinolone ointment (KENALOG) 0.5 % Apply 1 application topically 2 (two) times daily.   [DISCONTINUED] DULoxetine (CYMBALTA) 30  MG capsule TAKE 2 CAPSULES BY MOUTH EVERY DAY   [DISCONTINUED] gabapentin (NEURONTIN) 100 MG capsule Take 100mg  qam, 100 mg midday and 300mg  qhs.   [DISCONTINUED] meloxicam (MOBIC) 7.5 MG tablet Take 1 tablet (7.5 mg total) by mouth daily as needed for pain.   No facility-administered encounter medications on file as of 06/30/2022.    Allergies (verified) Codeine   History: Past Medical History:  Diagnosis Date   Arthritis    Basal cell carcinoma 06/25/2021   BCC, Left ear sup ant tragus, Mohs completed 08/18/21   Depression    GERD (gastroesophageal reflux disease)    Hypertension    Polyp of colon    Past Surgical History:  Procedure Laterality Date   BACK SURGERY     COLONOSCOPY WITH PROPOFOL N/A 12/13/2020   Procedure: COLONOSCOPY WITH PROPOFOL;  Surgeon: Wyline Mood, MD;  [facility] END[facility]COPY;  Service: Gastroenterology;  Laterality: N/A;   LUMBAR SPINE SURGERY  1990   Dr [name] neurosurgery   LUMBAR SPINE SURGERY  2001   Dr Reita Cliche   NECK SURGERY  1994, 1997, 2001   plate and 2 screws in neck; 2001 cerical fusion Dr Kieth Brightly   OTHER SURGICAL HISTORY     right knee Right 1999   meniscal repair, arthoscopic   TONSILLECTOMY  1965   Family History  Problem Relation Age of Onset   Hypertension Father    Brain  cancer Father 85   COPD Mother 59   Prostate cancer Brother    Prostate cancer Brother    Heart attack Neg Hx    Social History   Socioeconomic History   Marital status: Married    Spouse name: Not on file   Number of children: Not on file   Years of education: Not on file   Highest education level: Not on file  Occupational History   Not on file  Tobacco Use   Smoking status: Never   Smokeless tobacco: Never  Vaping Use   Vaping Use: Never used  Substance and Sexual Activity   Alcohol use: Yes   Drug use: Never   Sexual activity: Not Currently  Other Topics Concern   Not on file  Social History Narrative   2 girl grandchildren - 6, and 1  years   Wife is patient of mine      2003 - went on disability   Social Determinants of Corporate investment banker Strain: Low Risk  (06/30/2022)   Overall Financial Resource Strain (CARDIA)    Difficulty of Paying Living Expenses: Not hard at all  Food Insecurity: No Food Insecurity (06/30/2022)   Hunger Vital Sign    Worried About Running Out of Food in the Last Year: Never true    Ran Out of Food in the Last Year: Never true  Transportation Needs: No Transportation Needs (06/30/2022)   PRAPARE - Administrator, Civil Service (Medical): No    Lack of Transportation (Non-Medical): No  Physical Activity: Insufficiently Active (06/30/2022)   Exercise Vital Sign    Days of Exercise per Week: 7 days    Minutes of Exercise per Session: 20 min  Stress: No Stress Concern Present (05/08/2021)   [employer] of Occupational Health - Occupational Stress Questionnaire    Feeling of Stress : Only a little  Social Connections: Socially Integrated (06/30/2022)   Social Connection and Isolation Panel [NHANES]    Frequency of Communication with Friends and Family: More than three times a week    Frequency of Social Gatherings with Friends and Family: More than three times a week    Attends Religious Services: More than 4 times per year    Active Member of [employer] or Organizations: Yes    Attends Engineer, structural: More than 4 times per year    Marital Status: Married    Tobacco Counseling Counseling given: Yes   Clinical Intake:  Pre-visit preparation completed: Yes  Pain : 0-10 Pain Score: 5  Pain Type: Chronic pain Pain Location: Knee Pain Orientation: Right Pain Descriptors / Indicators: Aching Pain Onset: More than a month ago     BMI - recorded: 26.63 Nutritional Status: BMI 25 -29 Overweight Nutritional Risks: None Diabetes: No  How often do you need to have someone help you when you read instructions, pamphlets, or other written materials from  your doctor or pharmacy?: (P) 2 - Rarely  Diabetic?no  Interpreter Needed?: No  Information entered by :: Fredirick Maudlin   Activities of Daily Living    06/30/2022   12:58 PM  In your present state of health, do you have any difficulty performing the following activities:  Hearing? 0  Vision? 0  Difficulty concentrating or making decisions? 0  Walking or climbing stairs? 0  Dressing or bathing? 0  Doing errands, shopping? 0  Preparing Food and eating ? N  Using the Toilet? N  In the past six months,  have you accidently leaked urine? N  Do you have problems with loss of bowel control? N  Managing your Medications? N  Managing your Finances? N  Housekeeping or managing your Housekeeping? N    Patient Care Team: Allegra Grana, FNP as PCP - General (Family Medicine)  Indicate any recent Medical Services you may have received from other than [facility] providers in the past year (date may be approximate).     Assessment:   This is a routine wellness examination for [place].  Hearing/Vision screen Hearing Screening - Comments:: Denies hearing difficulties   Vision Screening - Comments:: Wears rx glasses - up to date with routine eye exams [facility] [place]   Dietary issues and exercise activities discussed: Current Exercise Habits: Home exercise routine, Type of exercise: walking, Time (Minutes): 45, Frequency (Times/Week): 6, Weekly Exercise (Minutes/Week): 270, Intensity: Mild, Exercise limited by: orthopedic condition(s) (back pain)   Goals Addressed   None   Depression Screen    06/30/2022    1:00 PM 02/25/2022    9:12 AM 08/25/2021    9:07 AM 07/09/2021    1:36 PM 05/08/2021    8:24 AM 06/11/2020   10:37 AM 08/11/2019   12:40 PM  PHQ 2/9 Scores  PHQ - 2 Score 0 0 0 0 0 0 0  PHQ- 9 Score      4     Fall Risk    06/30/2022   12:58 PM 02/25/2022    9:12 AM 08/25/2021    9:07 AM 07/09/2021    1:36 PM 05/08/2021    8:27 AM  Fall Risk   Falls in the  past year? 0 0 0 0 0  Number falls in past yr: 0 0 0 0 0  Injury with Fall? 0 0 0 0   Risk for fall due to :  No Fall Risks No Fall Risks No Fall Risks   Follow up Falls prevention discussed;Falls evaluation completed Falls evaluation completed Falls evaluation completed Falls evaluation completed Falls evaluation completed    FALL RISK PREVENTION PERTAINING TO THE HOME:  Any stairs in or around the home? No  If so, are there any without handrails? No  Home free of loose throw rugs in walkways, pet beds, electrical cords, etc? No  Adequate lighting in your home to reduce risk of falls? Yes   ASSISTIVE DEVICES UTILIZED TO PREVENT FALLS:  Life alert? No  Use of a cane, walker or w/c? No  Grab bars in the bathroom? No  Shower chair or bench in shower? No  Elevated toilet seat or a handicapped toilet? No   TIMED UP AND GO:  Was the test performed?  NO televisit .    Cognitive Function:    08/11/2019   12:50 PM  MMSE - Mini Mental State Exam  Not completed: Unable to complete        06/30/2022    1:00 PM 08/11/2019    1:11 PM  6CIT Screen  What Year? 0 points 0 points  What month? 0 points 0 points  What time? 0 points 0 points  Count back from 20 0 points   Months in reverse 0 points   Repeat phrase 2 points   Total Score 2 points     Immunizations Immunization History  Administered Date(s) Administered   Fluad Quad(high Dose 65+) 10/25/2020   Influenza,inj,Quad PF,6+ Mos 11/12/2018, 01/29/2020   PFIZER(Purple Top)SARS-COV-2 Vaccination 05/12/2019, 06/06/2019, 02/03/2020   Pfizer Covid-19 Theatre manager  79yrs & up 12/16/2020   Tdap 08/01/2011    TDAP status: Due, Education has been provided regarding the importance of this vaccine. Advised may receive this vaccine at local pharmacy or Health Dept. Aware to provide a copy of the vaccination record if obtained from local pharmacy or Health Dept. Verbalized acceptance and understanding.  Flu Vaccine  status: Declined, Education has been provided regarding the importance of this vaccine but patient still declined. Advised may receive this vaccine at local pharmacy or Health Dept. Aware to provide a copy of the vaccination record if obtained from local pharmacy or Health Dept. Verbalized acceptance and understanding.  Pneumococcal vaccine status: Up to date  Covid-19 vaccine status: Completed vaccines  Qualifies for Shingles Vaccine? Yes   Zostavax completed No   Shingrix Completed?: No.    Education has been provided regarding the importance of this vaccine. Patient has been advised to call insurance company to determine out of pocket expense if they have not yet received this vaccine. Advised may also receive vaccine at local pharmacy or Health Dept. Verbalized acceptance and understanding.  Screening Tests Health Maintenance  Topic Date Due   DTaP/Tdap/Td (2 - Td or Tdap) 07/31/2021   COVID-19 Vaccine (5 - 2023-24 season) 10/17/2021   INFLUENZA VACCINE  09/17/2022   Medicare Annual Wellness (AWV)  06/30/2023   COLONOSCOPY (Pts 45-70yrs Insurance coverage will need to be confirmed)  12/14/2027   Hepatitis C Screening  Completed   HPV VACCINES  Aged Out   Pneumonia Vaccine 51+ Years old  Discontinued   Zoster Vaccines- Shingrix  Discontinued    Health Maintenance  Health Maintenance Due  Topic Date Due   DTaP/Tdap/Td (2 - Td or Tdap) 07/31/2021   COVID-19 Vaccine (5 - 2023-24 season) 10/17/2021    Colorectal cancer screening: Type of screening: Colonoscopy. Completed 12/13/20. Repeat every 5 years  Lung Cancer Screening: (Low Dose CT Chest recommended if Age 62-80 years, 30 pack-year currently smoking OR have quit w/in 15years.) does not qualify.    Additional Screening:  Hepatitis C Screening: does qualify; Completed 03/25/18  Vision Screening: Recommended annual ophthalmology exams for early detection of glaucoma and other disorders of the eye. Is the patient up to date  with their annual eye exam?  Yes  Who is the provider or what is the name of the office in which the patient attends annual eye exams? [facility] If pt is not established with a provider, would they like to be referred to a provider to establish care? No .   Dental Screening: Recommended annual dental exams for proper oral hygiene  Community Resource Referral / Chronic Care Management: CRR required this visit?  No   CCM required this visit?  No      Plan:     I have personally reviewed and noted the following in the patient's chart:   Medical and social history Use of alcohol, tobacco or illicit drugs  Current medications and supplements including opioid prescriptions. Patient is not currently taking opioid prescriptions. Functional ability and status Nutritional status Physical activity Advanced directives List of other physicians Hospitalizations, surgeries, and ER visits in previous 12 months Vitals Screenings to include cognitive, depression, and falls Referrals and appointments  In addition, I have reviewed and discussed with patient certain preventive protocols, quality metrics, and best practice recommendations. A written personalized care plan for preventive services as well as general preventive health recommendations were provided to patient.     Annabell Sabal, CMA   06/30/2022  Nurse Notes: none

## 2022-06-30 NOTE — Patient Instructions (Signed)
Jeffrey Yang , Thank you for taking time to come for your Medicare Wellness Visit. I appreciate your ongoing commitment to your health goals. Please review the following plan we discussed and let me know if I can assist you in the future.   These are the goals we discussed:  Goals      Follow up with Primary Care Provider     As needed.        This is a list of the screening recommended for you and due dates:  Health Maintenance  Topic Date Due   DTaP/Tdap/Td vaccine (2 - Td or Tdap) 07/31/2021   COVID-19 Vaccine (5 - 2023-24 season) 10/17/2021   Flu Shot  09/17/2022   Medicare Annual Wellness Visit  06/30/2023   Colon Cancer Screening  12/14/2027   Hepatitis C Screening: USPSTF Recommendation to screen - Ages 18-79 yo.  Completed   HPV Vaccine  Aged Out   Pneumonia Vaccine  Discontinued   Zoster (Shingles) Vaccine  Discontinued    Advanced directives: Advance directive discussed with you today. Even though you declined this today, please call our office should you change your mind, and we can give you the proper paperwork for you to fill out.   Conditions/risks identified: Aim for 30 minutes of exercise or brisk walking, 6-8 glasses of water, and 5 servings of fruits and vegetables each day.   Next appointment: Follow up in one year for your annual wellness visit.   Preventive Care 110 Years and Older, Male  Preventive care refers to lifestyle choices and visits with your health care provider that can promote health and wellness. What does preventive care include? A yearly physical exam. This is also called an annual well check. Dental exams once or twice a year. Routine eye exams. Ask your health care provider how often you should have your eyes checked. Personal lifestyle choices, including: Daily care of your teeth and gums. Regular physical activity. Eating a healthy diet. Avoiding tobacco and drug use. Limiting alcohol use. Practicing safe sex. Taking low doses of  aspirin every day. Taking vitamin and mineral supplements as recommended by your health care provider. What happens during an annual well check? The services and screenings done by your health care provider during your annual well check will depend on your age, overall health, lifestyle risk factors, and family history of disease. Counseling  Your health care provider may ask you questions about your: Alcohol use. Tobacco use. Drug use. Emotional well-being. Home and relationship well-being. Sexual activity. Eating habits. History of falls. Memory and ability to understand (cognition). Work and work Astronomer. Screening  You may have the following tests or measurements: Height, weight, and BMI. Blood pressure. Lipid and cholesterol levels. These may be checked every 5 years, or more frequently if you are over 60 years old. Skin check. Lung cancer screening. You may have this screening every year starting at age 63 if you have a 30-pack-year history of smoking and currently smoke or have quit within the past 15 years. Fecal occult blood test (FOBT) of the stool. You may have this test every year starting at age 56. Flexible sigmoidoscopy or colonoscopy. You may have a sigmoidoscopy every 5 years or a colonoscopy every 10 years starting at age 41. Prostate cancer screening. Recommendations will vary depending on your family history and other risks. Hepatitis C blood test. Hepatitis B blood test. Sexually transmitted disease (STD) testing. Diabetes screening. This is done by checking your blood sugar (glucose) after you  have not eaten for a while (fasting). You may have this done every 1-3 years. Abdominal aortic aneurysm (AAA) screening. You may need this if you are a current or former smoker. Osteoporosis. You may be screened starting at age 74 if you are at high risk. Talk with your health care provider about your test results, treatment options, and if necessary, the need for more  tests. Vaccines  Your health care provider may recommend certain vaccines, such as: Influenza vaccine. This is recommended every year. Tetanus, diphtheria, and acellular pertussis (Tdap, Td) vaccine. You may need a Td booster every 10 years. Zoster vaccine. You may need this after age 11. Pneumococcal 13-valent conjugate (PCV13) vaccine. One dose is recommended after age 71. Pneumococcal polysaccharide (PPSV23) vaccine. One dose is recommended after age 51. Talk to your health care provider about which screenings and vaccines you need and how often you need them. This information is not intended to replace advice given to you by your health care provider. Make sure you discuss any questions you have with your health care provider. Document Released: 03/01/2015 Document Revised: 10/23/2015 Document Reviewed: 12/04/2014 Elsevier Interactive Patient Education  2017 [place] Prevention in the Home Falls can cause injuries. They can happen to people of all ages. There are many things you can do to make your home safe and to help prevent falls. What can I do on the outside of my home? Regularly fix the edges of walkways and driveways and fix any cracks. Remove anything that might make you trip as you walk through a door, such as a raised step or threshold. Trim any bushes or trees on the path to your home. Use bright outdoor lighting. Clear any walking paths of anything that might make someone trip, such as rocks or tools. Regularly check to see if handrails are loose or broken. Make sure that both sides of any steps have handrails. Any raised decks and porches should have guardrails on the edges. Have any leaves, snow, or ice cleared regularly. Use sand or salt on walking paths during winter. Clean up any spills in your garage right away. This includes oil or grease spills. What can I do in the bathroom? Use night lights. Install grab bars by the toilet and in the tub and shower.  Do not use towel bars as grab bars. Use non-skid mats or decals in the tub or shower. If you need to sit down in the shower, use a plastic, non-slip stool. Keep the floor dry. Clean up any water that spills on the floor as soon as it happens. Remove soap buildup in the tub or shower regularly. Attach bath mats securely with double-sided non-slip rug tape. Do not have throw rugs and other things on the floor that can make you trip. What can I do in the bedroom? Use night lights. Make sure that you have a light by your bed that is easy to reach. Do not use any sheets or blankets that are too big for your bed. They should not hang down onto the floor. Have a firm chair that has side arms. You can use this for support while you get dressed. Do not have throw rugs and other things on the floor that can make you trip. What can I do in the kitchen? Clean up any spills right away. Avoid walking on wet floors. Keep items that you use a lot in easy-to-reach places. If you need to reach something above you, use a strong step  stool that has a grab bar. Keep electrical cords out of the way. Do not use floor polish or wax that makes floors slippery. If you must use wax, use non-skid floor wax. Do not have throw rugs and other things on the floor that can make you trip. What can I do with my stairs? Do not leave any items on the stairs. Make sure that there are handrails on both sides of the stairs and use them. Fix handrails that are broken or loose. Make sure that handrails are as long as the stairways. Check any carpeting to make sure that it is firmly attached to the stairs. Fix any carpet that is loose or worn. Avoid having throw rugs at the top or bottom of the stairs. If you do have throw rugs, attach them to the floor with carpet tape. Make sure that you have a light switch at the top of the stairs and the bottom of the stairs. If you do not have them, ask someone to add them for you. What else  can I do to help prevent falls? Wear shoes that: Do not have high heels. Have rubber bottoms. Are comfortable and fit you well. Are closed at the toe. Do not wear sandals. If you use a stepladder: Make sure that it is fully opened. Do not climb a closed stepladder. Make sure that both sides of the stepladder are locked into place. Ask someone to hold it for you, if possible. Clearly mark and make sure that you can see: Any grab bars or handrails. First and last steps. Where the edge of each step is. Use tools that help you move around (mobility aids) if they are needed. These include: Canes. Walkers. Scooters. Crutches. Turn on the lights when you go into a dark area. Replace any light bulbs as soon as they burn out. Set up your furniture so you have a clear path. Avoid moving your furniture around. If any of your floors are uneven, fix them. If there are any pets around you, be aware of where they are. Review your medicines with your doctor. Some medicines can make you feel dizzy. This can increase your chance of falling. Ask your doctor what other things that you can do to help prevent falls. This information is not intended to replace advice given to you by your health care provider. Make sure you discuss any questions you have with your health care provider. Document Released: 11/29/2008 Document Revised: 07/11/2015 Document Reviewed: 03/09/2014 Elsevier Interactive Patient Education  2017 [employer].

## 2022-07-22 ENCOUNTER — Other Ambulatory Visit: Payer: Self-pay | Admitting: Family

## 2022-07-22 DIAGNOSIS — E785 Hyperlipidemia, unspecified: Secondary | ICD-10-CM

## 2022-08-10 ENCOUNTER — Telehealth: Payer: Self-pay | Admitting: Family

## 2022-08-10 DIAGNOSIS — I10 Essential (primary) hypertension: Secondary | ICD-10-CM

## 2022-08-17 NOTE — Telephone Encounter (Signed)
Prescription Request  08/17/2022  LOV: Visit date not found  What is the name of the medication or equipment?  nebivolol (BYSTOLIC) 5 MG tablet  Have you contacted your pharmacy to request a refill? Yes   Which pharmacy would you like this sent to?  [facility] [id number] [place], [place] - [address] [place] [address] Phone: [phone] Fax: [phone]    Patient notified that their request is being sent to the clinical staff for review and that they should receive a response within 2 business days.   Please advise at Mobile [phone] (mobile)

## 2022-08-21 NOTE — Telephone Encounter (Signed)
Medication was refilled on 08/18/2022.

## 2022-09-28 ENCOUNTER — Telehealth: Payer: Self-pay | Admitting: Family

## 2022-09-28 DIAGNOSIS — G8929 Other chronic pain: Secondary | ICD-10-CM

## 2022-09-28 MED ORDER — CYCLOBENZAPRINE HCL 10 MG PO TABS
ORAL_TABLET | ORAL | 1 refills | Status: AC
Start: 2022-09-28 — End: ?

## 2022-09-28 NOTE — Telephone Encounter (Signed)
Pharmacy called and said the patient called in a refill for cyclobenzaprine (FLEXERIL) 10 MG table and she said it didn't go through. The patient is out of the medication. The pharmacy is [facility] [id number] [place], [place] - [address] [place], [place] [address] Phone: [phone]  Fax: [phone].

## 2022-09-29 NOTE — Telephone Encounter (Signed)
Spoke to pharmacist and they stated that they had rx sent to [facility] but pt did pick up Medication

## 2023-02-21 ENCOUNTER — Other Ambulatory Visit: Payer: Self-pay | Admitting: Family

## 2023-02-21 DIAGNOSIS — F32A Depression, unspecified: Secondary | ICD-10-CM

## 2023-02-21 DIAGNOSIS — I10 Essential (primary) hypertension: Secondary | ICD-10-CM

## 2023-03-10 ENCOUNTER — Telehealth: Payer: Self-pay | Admitting: Family

## 2023-03-10 ENCOUNTER — Ambulatory Visit (INDEPENDENT_AMBULATORY_CARE_PROVIDER_SITE_OTHER): Payer: Medicare Other | Admitting: Family

## 2023-03-10 ENCOUNTER — Encounter: Payer: Self-pay | Admitting: Family

## 2023-03-10 VITALS — BP 138/78 | HR 63 | Temp 97.9°F | Resp 96 | Ht 67.0 in | Wt 176.0 lb

## 2023-03-10 DIAGNOSIS — Z136 Encounter for screening for cardiovascular disorders: Secondary | ICD-10-CM

## 2023-03-10 DIAGNOSIS — Z125 Encounter for screening for malignant neoplasm of prostate: Secondary | ICD-10-CM | POA: Diagnosis not present

## 2023-03-10 DIAGNOSIS — Z13220 Encounter for screening for lipoid disorders: Secondary | ICD-10-CM

## 2023-03-10 DIAGNOSIS — E785 Hyperlipidemia, unspecified: Secondary | ICD-10-CM | POA: Diagnosis not present

## 2023-03-10 DIAGNOSIS — I10 Essential (primary) hypertension: Secondary | ICD-10-CM | POA: Diagnosis not present

## 2023-03-10 DIAGNOSIS — R21 Rash and other nonspecific skin eruption: Secondary | ICD-10-CM | POA: Diagnosis not present

## 2023-03-10 DIAGNOSIS — R7303 Prediabetes: Secondary | ICD-10-CM | POA: Diagnosis not present

## 2023-03-10 DIAGNOSIS — M545 Low back pain, unspecified: Secondary | ICD-10-CM

## 2023-03-10 LAB — COMPREHENSIVE METABOLIC PANEL
ALT: 14 U/L (ref 0–53)
AST: 14 U/L (ref 0–37)
Albumin: 4.5 g/dL (ref 3.5–5.2)
Alkaline Phosphatase: 82 U/L (ref 39–117)
BUN: 14 mg/dL (ref 6–23)
CO2: 30 meq/L (ref 19–32)
Calcium: 9.8 mg/dL (ref 8.4–10.5)
Chloride: 103 meq/L (ref 96–112)
Creatinine, Ser: 1.12 mg/dL (ref 0.40–1.50)
GFR: 68.02 mL/min (ref >60.00)
Glucose, Bld: 133 mg/dL — ABNORMAL HIGH (ref 70–99)
Potassium: 4.5 meq/L (ref 3.5–5.1)
Sodium: 139 meq/L (ref 135–145)
Total Bilirubin: 0.6 mg/dL (ref 0.2–1.2)
Total Protein: 6.8 g/dL (ref 6.0–8.3)

## 2023-03-10 LAB — CBC WITH DIFFERENTIAL/PLATELET
Basophils Absolute: 0.1 10*3/uL (ref 0.0–0.1)
Basophils Relative: 0.8 % (ref 0.0–3.0)
Eosinophils Absolute: 0.3 10*3/uL (ref 0.0–0.7)
Eosinophils Relative: 4.3 % (ref 0.0–5.0)
HCT: 46.9 % (ref 39.0–52.0)
Hemoglobin: 16 g/dL (ref 13.0–17.0)
Lymphocytes Relative: 28.3 % (ref 12.0–46.0)
Lymphs Abs: 2.1 10*3/uL (ref 0.7–4.0)
MCHC: 34.2 g/dL (ref 30.0–36.0)
MCV: 92.6 fL (ref 78.0–100.0)
Monocytes Absolute: 0.5 10*3/uL (ref 0.1–1.0)
Monocytes Relative: 7.3 % (ref 3.0–12.0)
Neutro Abs: 4.3 10*3/uL (ref 1.4–7.7)
Neutrophils Relative %: 59.3 % (ref 43.0–77.0)
Platelets: 271 10*3/uL (ref 150.0–400.0)
RBC: 5.07 Mil/uL (ref 4.22–5.81)
RDW: 12.7 % (ref 11.5–15.5)
WBC: 7.2 10*3/uL (ref 4.0–10.5)

## 2023-03-10 LAB — LIPID PANEL
Cholesterol: 122 mg/dL (ref 0–200)
HDL: 41.1 mg/dL (ref >39.00)
LDL Cholesterol: 62 mg/dL (ref 0–99)
NonHDL: 80.69
Total CHOL/HDL Ratio: 3
Triglycerides: 94 mg/dL (ref 0.0–149.0)
VLDL: 18.8 mg/dL (ref 0.0–40.0)

## 2023-03-10 LAB — PSA: PSA: 2.22 ng/mL (ref 0.10–4.00)

## 2023-03-10 LAB — TSH: TSH: 1.69 u[IU]/mL (ref 0.35–5.50)

## 2023-03-10 LAB — VITAMIN D 25 HYDROXY (VIT D DEFICIENCY, FRACTURES): VITD: 45.77 ng/mL (ref 30.00–100.00)

## 2023-03-10 LAB — HEMOGLOBIN A1C: Hgb A1c MFr Bld: 6.2 % (ref 4.6–6.5)

## 2023-03-10 MED ORDER — CLOBETASOL PROPIONATE 0.05 % EX OINT
1.0000 | TOPICAL_OINTMENT | Freq: Two times a day (BID) | CUTANEOUS | 0 refills | Status: AC
Start: 2023-03-10 — End: ?

## 2023-03-10 MED ORDER — MUPIROCIN 2 % EX OINT: 1.0000 | TOPICAL_OINTMENT | Freq: Two times a day (BID) | CUTANEOUS | 2 refills | Status: AC

## 2023-03-10 MED ORDER — GABAPENTIN 100 MG PO CAPS: 100.0000 mg | ORAL_CAPSULE | Freq: Three times a day (TID) | ORAL | 3 refills | Status: DC

## 2023-03-10 NOTE — Patient Instructions (Addendum)
  Trial of clobetasol ointment for the rash on your chest.  Do not use longer than 1 week.  If does not improve, you may also trial Bactroban to cover for bacterial infection.  Please let me know if does not completely resolve as I would then reach out Dr. Avelino Leeds   As discussed, an annual skin exam is very important.  Please call and make an appointment to be evaluated.  [facility] [phone])    I ordered an lumbar spine.  I have also placed a referral back to [facility]   Let us know if you dont hear back within a week in regards to an appointment being scheduled.   So that you are aware, if you are [facility] MyChart user , please pay attention to your MyChart messages as you may receive a MyChart message with a phone number to call and schedule this test/appointment own your own from our referral coordinator. This is a new process so I do not want you to miss this message.  If you are not a MyChart user, you will receive a phone call.

## 2023-03-10 NOTE — Assessment & Plan Note (Addendum)
No systemic features.  Nontoxic in appearance . presentation consistent for Grovers disease.  Differential would include folliculitis.  Provided clobetasol to start.  I have also provided him with Bactroban to cover for secondary bacterial infection.  If rash persists, will arrange consult with dermatology for definitive diagnosis.  Attempted wound culture however lesions were quite dry today.  Pending wound culture, HSV.

## 2023-03-10 NOTE — Telephone Encounter (Signed)
  Call lab  I received a note about unambiguous order.  It looks like there is question of HSV antibody?  I am screening for both HSV-1 and HSV-2 antibody.  We also collected a wound culture  Please get more information here and clarify to labs can be processed

## 2023-03-10 NOTE — Progress Notes (Signed)
Assessment & Plan:  Rash Assessment & Plan: No systemic features.  Nontoxic in appearance . presentation consistent for Grovers disease.  Differential would include folliculitis.  Provided clobetasol to start.  I have also provided him with Bactrim to cover for secondary bacterial infection.  If rash persists, will arrange consult with dermatology for definitive diagnosis.  Attempted wound culture however lesions were quite dry today.  Pending wound culture, HSV.    Orders: -     Clobetasol Propionate; Apply 1 Application topically 2 (two) times daily. Use sparingly for < 1 week  Dispense: 30 g; Refill: 0 -     CBC with Differential/Platelet -     Comprehensive metabolic panel -     VITAMIN D 25 Hydroxy (Vit-D Deficiency, Fractures) -     HSV 1/2 Ab (IgM), IFA w/rflx Titer -     Mupirocin; Apply 1 Application topically 2 (two) times daily.  Dispense: 22 g; Refill: 2 -     WOUND CULTURE  Essential hypertension -     TSH  Hyperlipidemia, unspecified hyperlipidemia type -     PSA  Screening for prostate cancer -     PSA  Encounter for lipid screening for cardiovascular disease -     Lipid panel  Prediabetes -     Hemoglobin A1c  Low back pain, unspecified back pain laterality, unspecified chronicity, unspecified whether sciatica present Assessment & Plan: Acute on chronic.  Concern for progression.  Pending MRI.  Continue gabapentin 100 3 times daily.  Continue Flexeril 10 mg every day as needed  Orders: -     Gabapentin; Take 1 capsule (100 mg total) by mouth 3 (three) times daily.  Dispense: 90 capsule; Refill: 3 -     MR LUMBAR SPINE W WO CONTRAST; Future -     Ambulatory referral to Neurosurgery  Other orders -     TEST IN QUESTION AMBIGUOUS ORDER     Return precautions given.   Risks, benefits, and alternatives of the medications and treatment plan prescribed today were discussed, and patient expressed understanding.   Education regarding symptom management and  diagnosis given to patient on AVS either electronically or printed.  No follow-ups on file.  Rennie Plowman, FNP  Subjective:    Patient ID: Jeffrey Yang, male    DOB: 10/29/1955, 68 y.o.   MRN: [id number]  CC: Jeffrey Yang is a 68 y.o. male who presents today for an acute visit.    HPI: Complains of painful and itchy rash on his chest x 1 week Has tried Kenalog 0.5% cream Papules 'come to a head.'   No new lotions , creams, medication    History of Grovers disease.      History of basal cell carcinoma, left ear.  Non-smoker   Chronic low back pain for over a year after catching his granddaughter.   Episodic left low back numbness and radiates to left thigh No falls No urinary or fecal incontinence, saddle anesthesia   Requests refill gabapentin.   MR lumbar 2010 slight increase in size of a shallow disc protrusion at L3-4  Previous right hemilaminectomy and discectomy at L4-5.    Allergies: Codeine Current Outpatient Medications on File Prior to Visit  Medication Sig Dispense Refill   amLODipine (NORVASC) 10 MG tablet TAKE 1 TABLET BY MOUTH DAILY 90 tablet 3   cyclobenzaprine (FLEXERIL) 10 MG tablet Take 1/2 tablet by mouth 3 times a day as needed for muscles spasm 30 tablet 1  DULoxetine (CYMBALTA) 60 MG capsule TAKE 1 CAPSULE BY MOUTH DAILY 90 capsule 3   nebivolol (BYSTOLIC) 5 MG tablet TAKE 1/2 TABLET BY MOUTH DAILY 45 tablet 3   rosuvastatin (CRESTOR) 10 MG tablet TAKE 1 TABLET BY MOUTH DAILY 90 tablet 3   traZODone (DESYREL) 50 MG tablet Take 1 tablet (50 mg total) by mouth at bedtime. for sleep 90 tablet 3   triamcinolone ointment (KENALOG) 0.5 % Apply 1 application topically 2 (two) times daily. 30 g 0   No current facility-administered medications on file prior to visit.    Review of Systems  Constitutional:  Negative for chills and fever.  Respiratory:  Negative for cough.   Cardiovascular:  Negative for chest pain and palpitations.   Gastrointestinal:  Negative for nausea and vomiting.  Musculoskeletal:  Positive for back pain.  Skin:  Positive for rash.  Neurological:  Positive for numbness.      Objective:    BP 138/78   Pulse 63   Temp 97.9 F (36.6 C) (Oral)   Resp (!) 96   Ht 5\' 7"  (1.702 m)   Wt 176 lb (79.8 kg)   BMI 27.57 kg/m   BP Readings from Last 3 Encounters:  03/10/23 138/78  02/25/22 130/78  08/25/21 124/78   Wt Readings from Last 3 Encounters:  03/10/23 176 lb (79.8 kg)  06/30/22 170 lb (77.1 kg)  02/25/22 170 lb 3.2 oz (77.2 kg)    Physical Exam Vitals reviewed.  Constitutional:      Appearance: He is well-developed.  Cardiovascular:     Rate and Rhythm: Regular rhythm.     Heart sounds: Normal heart sounds.  Pulmonary:     Effort: Pulmonary effort is normal. No respiratory distress.     Breath sounds: Normal breath sounds. No wheezing, rhonchi or rales.  Musculoskeletal:     Lumbar back: No swelling, spasms or tenderness. Normal range of motion.     Comments: Full range of motion with flexion, extension, lateral side bends. No pain, numbness, tingling elicited with single leg raise bilaterally. No rash. Left  Hip: No limp or waddling gait. Full ROM with flexion and hip rotation in flexion.    No pain of lateral hip with  (flexion-abduction-external rotation) test.     Skin:    General: Skin is warm and dry.  Neurological:     Mental Status: He is alert.  Psychiatric:        Speech: Speech normal.        Behavior: Behavior normal.

## 2023-03-11 NOTE — Telephone Encounter (Signed)
Called [facility] Phone: [phone] but was put on hold for about 20 mins, will try back on tomorrow 03/12/23

## 2023-03-12 NOTE — Assessment & Plan Note (Signed)
Acute on chronic.  Concern for progression.  Pending MRI.  Continue gabapentin 100 3 times daily.  Continue Flexeril 10 mg every day as needed

## 2023-03-15 ENCOUNTER — Telehealth: Payer: Self-pay | Admitting: Family

## 2023-03-15 LAB — WOUND CULTURE
MICRO NUMBER:: 15984195
SPECIMEN QUALITY:: ADEQUATE

## 2023-03-15 NOTE — Telephone Encounter (Signed)
Lft pt vm to call ofc to sch MRI. thanks

## 2023-03-15 NOTE — Telephone Encounter (Signed)
Spoke to [place] @ [facility] and was able to give the correct code to be able to run tests needed properly

## 2023-03-17 ENCOUNTER — Encounter: Payer: Self-pay | Admitting: Family

## 2023-03-17 ENCOUNTER — Other Ambulatory Visit: Payer: Self-pay | Admitting: Family

## 2023-03-17 DIAGNOSIS — R21 Rash and other nonspecific skin eruption: Secondary | ICD-10-CM

## 2023-03-17 LAB — TIQ- AMBIGUOUS ORDER

## 2023-03-17 LAB — HERPES SIMPLEX VIRUS 1 AND 2 (IGG),REFLEX HSV-2 INHIBITION
HSV 1 IGG,TYPE SPECIFIC AB: <0.9 {index} (ref <0.90)
HSV 2 IGG,TYPE SPECIFIC AB: <0.9 {index} (ref <0.90)

## 2023-03-17 LAB — TEST AUTHORIZATION

## 2023-03-17 MED ORDER — DOXYCYCLINE HYCLATE 100 MG PO TABS: 100.0000 mg | ORAL_TABLET | Freq: Two times a day (BID) | ORAL | 0 refills | Status: AC

## 2023-03-19 ENCOUNTER — Ambulatory Visit
Admission: RE | Admit: 2023-03-19 | Discharge: 2023-03-19 | Disposition: A | Discharge status: Home / Self Care | Payer: Medicare Other | Source: Ambulatory Visit | Attending: Family | Admitting: Family

## 2023-03-19 DIAGNOSIS — M545 Low back pain, unspecified: Secondary | ICD-10-CM | POA: Diagnosis present

## 2023-03-19 MED ORDER — GADOBUTROL 1 MMOL/ML IV SOLN
7.5000 mL | Freq: Once | INTRAVENOUS | Status: AC | PRN
  Administered 2023-03-19: 7.5 mL via INTRAVENOUS

## 2023-03-26 ENCOUNTER — Other Ambulatory Visit: Payer: Self-pay | Admitting: Family

## 2023-03-26 ENCOUNTER — Telehealth: Payer: Self-pay

## 2023-03-26 ENCOUNTER — Encounter: Payer: Self-pay | Admitting: Family

## 2023-03-26 DIAGNOSIS — M545 Low back pain, unspecified: Secondary | ICD-10-CM

## 2023-03-26 NOTE — Telephone Encounter (Signed)
 Spoke to receptionist @ [facility] and they will push through the MRI should be in pt chart this afternoon. Pt has been notified

## 2023-03-27 ENCOUNTER — Encounter: Payer: Self-pay | Admitting: Family

## 2023-03-27 ENCOUNTER — Other Ambulatory Visit
Admission: RE | Admit: 2023-03-27 | Discharge: 2023-03-27 | Disposition: A | Discharge status: Home / Self Care | Payer: Medicare Other | Attending: Family | Admitting: Family

## 2023-03-27 DIAGNOSIS — M545 Low back pain, unspecified: Secondary | ICD-10-CM | POA: Insufficient documentation

## 2023-03-27 LAB — CBC WITH DIFFERENTIAL/PLATELET
Abs Immature Granulocytes: 0.02 10*3/uL (ref 0.00–0.07)
Basophils Absolute: 0.1 10*3/uL (ref 0.0–0.1)
Basophils Relative: 1 %
Eosinophils Absolute: 0.2 10*3/uL (ref 0.0–0.5)
Eosinophils Relative: 2 %
HCT: 45.9 % (ref 39.0–52.0)
Hemoglobin: 16.3 g/dL (ref 13.0–17.0)
Immature Granulocytes: 0 %
Lymphocytes Relative: 27 %
Lymphs Abs: 2.6 10*3/uL (ref 0.7–4.0)
MCH: 31.9 pg (ref 26.0–34.0)
MCHC: 35.5 g/dL (ref 30.0–36.0)
MCV: 89.8 fL (ref 80.0–100.0)
Monocytes Absolute: 0.6 10*3/uL (ref 0.1–1.0)
Monocytes Relative: 6 %
Neutro Abs: 6.3 10*3/uL (ref 1.7–7.7)
Neutrophils Relative %: 64 %
Platelets: 255 10*3/uL (ref 150–400)
RBC: 5.11 MIL/uL (ref 4.22–5.81)
RDW: 12 % (ref 11.5–15.5)
WBC: 9.8 10*3/uL (ref 4.0–10.5)
nRBC: 0 % (ref 0.0–0.2)

## 2023-03-27 LAB — SEDIMENTATION RATE: Sed Rate: 3 mm/h (ref 0–20)

## 2023-03-27 LAB — C-REACTIVE PROTEIN: CRP: 0.6 mg/dL (ref <1.0)

## 2023-04-26 ENCOUNTER — Other Ambulatory Visit: Payer: Self-pay | Admitting: Family

## 2023-04-26 DIAGNOSIS — M25561 Pain in right knee: Secondary | ICD-10-CM

## 2023-04-27 ENCOUNTER — Other Ambulatory Visit: Payer: Self-pay | Admitting: Family

## 2023-04-27 DIAGNOSIS — G4700 Insomnia, unspecified: Secondary | ICD-10-CM

## 2023-04-27 DIAGNOSIS — F32A Depression, unspecified: Secondary | ICD-10-CM

## 2023-05-18 ENCOUNTER — Ambulatory Visit (INDEPENDENT_AMBULATORY_CARE_PROVIDER_SITE_OTHER): Admitting: Family

## 2023-05-18 VITALS — BP 134/68 | HR 61 | Temp 97.9°F | Ht 67.0 in | Wt 175.2 lb

## 2023-05-18 DIAGNOSIS — M545 Low back pain, unspecified: Secondary | ICD-10-CM

## 2023-05-18 MED ORDER — OMEPRAZOLE 20 MG PO CPDR
20.0000 mg | DELAYED_RELEASE_CAPSULE | Freq: Every day | ORAL | 3 refills | Status: AC
Start: 2023-05-18 — End: ?

## 2023-05-18 NOTE — Patient Instructions (Signed)
 Take gabapentin 300mg  every day.   As discussed, let's start by scheduling Tylenol Arthritis which is a 650mg  tablet .   You may take 1-2 tablets every 8 hours ( scheduled) with maximum of 6 tablets per day. Most adults can safely take 4 pills total per day of Tylenol Arthritis 650mg  tablet. Do not exceed 6 tablets a day of Tylenol Arthritis 650mg  tablet   For example , you could take two tablets in the morning ( 8am) and then two tablets again at 4pm.   Maximum daily dose of acetaminophen 4 g per day from all sources.  If you are taking another medication which includes acetaminophen (Tylenol) which may be in cough and cold preparations or pain medication such as Percocet, you will need to factor that into your total daily dose to be safe.  Please let me know if any questions  A great article regarding how to safely take and dose tylenol found below.  Title : 'Acetaminophen safety: Be cautious but not afraid'  [email or web address].https://gentry.org/

## 2023-05-18 NOTE — Progress Notes (Unsigned)
   Assessment & Plan:  There are no diagnoses linked to this encounter.   Return precautions given.   Risks, benefits, and alternatives of the medications and treatment plan prescribed today were discussed, and patient expressed understanding.   Education regarding symptom management and diagnosis given to patient on AVS either electronically or printed.  No follow-ups on file.  Jeffrey Plowman, FNP  Subjective:    Patient ID: Jeffrey Yang, male    DOB: 07-27-1955, 68 y.o.   MRN: [id number]  CC: Jeffrey Yang is a 68 y.o. male who presents today for an acute visit.    HPI: HPI He completed prednisone taper.  Dr Franky Macho recommended ESI and patient declined.   He has had ESI in 2002.   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  New right disc extrusion L5 to S1  CRP and Sed rate normal 03/27/23   Allergies: Codeine Current Outpatient Medications on File Prior to Visit  Medication Sig Dispense Refill   amLODipine (NORVASC) 10 MG tablet TAKE 1 TABLET BY MOUTH DAILY 90 tablet 3   clobetasol ointment (TEMOVATE) 0.05 % Apply 1 Application topically 2 (two) times daily. Use sparingly for < 1 week 30 g 0   cyclobenzaprine (FLEXERIL) 10 MG tablet Take 1/2 tablet by mouth 3 times a day as needed for muscles spasm 30 tablet 1   DULoxetine (CYMBALTA) 60 MG capsule TAKE 1 CAPSULE BY MOUTH DAILY 90 capsule 3   gabapentin (NEURONTIN) 100 MG capsule Take 1 capsule (100 mg total) by mouth 3 (three) times daily. 90 capsule 3   meloxicam (MOBIC) 7.5 MG tablet Take 1 tablet (7.5 mg total) by mouth daily as needed for pain. 30 tablet 2   mupirocin ointment (BACTROBAN) 2 % Apply 1 Application topically 2 (two) times daily. 22 g 2   nebivolol (BYSTOLIC) 5 MG tablet TAKE 1/2 TABLET BY MOUTH DAILY 45 tablet 3    rosuvastatin (CRESTOR) 10 MG tablet TAKE 1 TABLET BY MOUTH DAILY 90 tablet 3   traZODone (DESYREL) 50 MG tablet TAKE 1 TABLET BY MOUTH EVERY NIGHT AT BEDTIME AS NEEDED FOR SLEEP 80 tablet 2   triamcinolone ointment (KENALOG) 0.5 % Apply 1 application topically 2 (two) times daily. 30 g 0   No current facility-administered medications on file prior to visit.    Review of Systems    Objective:    BP 134/68   Pulse 61   Temp 97.9 F (36.6 C) (Oral)   Ht 5\' 7"  (1.702 m)   Wt 175 lb 3.2 oz (79.5 kg)   SpO2 98%   BMI 27.44 kg/m   BP Readings from Last 3 Encounters:  05/18/23 134/68  03/10/23 138/78  02/25/22 130/78   Wt Readings from Last 3 Encounters:  05/18/23 175 lb 3.2 oz (79.5 kg)  03/10/23 176 lb (79.8 kg)  06/30/22 170 lb (77.1 kg)    Physical Exam

## 2023-05-20 ENCOUNTER — Encounter: Payer: Self-pay | Admitting: Family

## 2023-05-20 NOTE — Assessment & Plan Note (Signed)
 Chronic, uncontrolled.  Discussed taking gabapentin 300 mg nightly as daytime doses are too sedating for him.  Continue Cymbalta 60 mg daily.   Advised that he may continue meloxicam daily at this time however to prevent gastritis I have also prescribed omeprazole 20mg  every day. We will aim to stop daily use of meloxicam once pain is better controlled.Advised to schedule Tylenol arthritis 650mg .  Collaborated with Dr. Cherylann Ratel in regards to light sedation that he is able to provide.  His office will reach out to schedule.  Referral is in place as well.

## 2023-05-22 ENCOUNTER — Other Ambulatory Visit: Payer: Self-pay | Admitting: Family

## 2023-05-22 DIAGNOSIS — E785 Hyperlipidemia, unspecified: Secondary | ICD-10-CM

## 2023-06-24 ENCOUNTER — Other Ambulatory Visit: Payer: Self-pay | Admitting: Family

## 2023-06-24 DIAGNOSIS — I10 Essential (primary) hypertension: Secondary | ICD-10-CM

## 2023-06-24 MED ORDER — NEBIVOLOL HCL 5 MG PO TABS: 2.5000 mg | ORAL_TABLET | Freq: Every day | ORAL | 3 refills | Status: AC

## 2023-07-01 ENCOUNTER — Ambulatory Visit: Admitting: Student in an Organized Health Care Education/Training Program

## 2023-07-02 ENCOUNTER — Ambulatory Visit: Payer: Medicare Other | Admitting: *Deleted

## 2023-07-02 VITALS — Ht 67.0 in | Wt 170.0 lb

## 2023-07-02 DIAGNOSIS — Z0000 Encounter for general adult medical examination without abnormal findings: Secondary | ICD-10-CM | POA: Diagnosis not present

## 2023-07-02 NOTE — Patient Instructions (Signed)
 Mr. Jeffrey Yang , Thank you for taking time out of your busy schedule to complete your Annual Wellness Visit with me. I enjoyed our conversation and look forward to speaking with you again next year. I, as well as your care team,  appreciate your ongoing commitment to your health goals. Please review the following plan we discussed and let me know if I can assist you in the future. Your Game plan/ To Do List    Referrals: If you haven't heard from the office you've been referred to, please reach out to them at the phone provided.   Remember to update your tetanus vaccine. Consider updating your other vaccines and get a flu shot annually.  Follow up Visits: Next Medicare AWV with our clinical staff: 07/05/24 @ 11:30   Have you seen your provider in the last 6 months (3 months if uncontrolled diabetes)? Yes Next Office Visit with your provider: Will call and schedule an appointment after his back injections  Clinician Recommendations:  Aim for 30 minutes of exercise or brisk walking, 6-8 glasses of water, and 5 servings of fruits and vegetables each day.       This is a list of the screening recommended for you and due dates:  Health Maintenance  Topic Date Due   DTaP/Tdap/Td vaccine (2 - Td or Tdap) 07/31/2021   COVID-19 Vaccine (5 - 2024-25 season) 10/18/2022   Flu Shot  09/17/2023   Medicare Annual Wellness Visit  07/01/2024   Colon Cancer Screening  12/14/2027   Hepatitis C Screening  Completed   HPV Vaccine  Aged Out   Meningitis B Vaccine  Aged Out   Pneumonia Vaccine  Discontinued   Zoster (Shingles) Vaccine  Discontinued    Advanced directives: (ACP Link)Information on Advanced Care Planning can be found at [facility]  Best boy Advance Health Care Directives Advance Health Care Directives. [email or web address]  Advance Care Planning is important because it:  [x]  Makes sure you receive the medical care that is consistent with your values, goals, and preferences  [x]  It provides  guidance to your family and loved ones and reduces their decisional burden about whether or not they are making the right decisions based on your wishes.  Follow the link provided in your after visit summary or read over the paperwork we have mailed to you to help you started getting your Advance Directives in place. If you need assistance in completing these, please reach out to us  so that we can help you!

## 2023-07-02 NOTE — Progress Notes (Signed)
 Subjective:   Jeffrey Yang is a 68 y.o. who presents for a Medicare Wellness preventive visit.  As a reminder, Annual Wellness Visits don't include a physical exam, and some assessments may be limited, especially if this visit is performed virtually. We may recommend an in-person follow-up visit with your provider if needed.  Visit Complete: Virtual I connected with  Jeffrey Yang on 07/02/23 by a audio enabled telemedicine application and verified that I am speaking with the correct person using two identifiers.  Patient Location: Home  Provider Location: Home Office  I discussed the limitations of evaluation and management by telemedicine. The patient expressed understanding and agreed to proceed.  Vital Signs: Because this visit was a virtual/telehealth visit, some criteria may be missing or patient reported. Any vitals not documented were not able to be obtained and vitals that have been documented are patient reported.  VideoDeclined- This patient declined Librarian, academic. Therefore the visit was completed with audio only.  Persons Participating in Visit: Patient.  AWV Questionnaire: No: Patient Medicare AWV questionnaire was not completed prior to this visit.  Cardiac Risk Factors include: advanced age (>47men, >82 women);male gender;hypertension;dyslipidemia     Objective:     Today's Vitals   07/02/23 1051 07/02/23 1052  Weight: 170 lb (77.1 kg)   Height: 5\' 7"  (1.702 m)   PainSc:  7    Body mass index is 26.63 kg/m.     07/02/2023   11:07 AM 06/30/2022    1:05 PM 05/08/2021    8:26 AM 12/13/2020   10:08 AM 08/11/2019   12:38 PM  Advanced Directives  Does Patient Have a Medical Advance Directive? No No No No No  Would patient like information on creating a medical advance directive? No - Patient declined No - Patient declined No - Patient declined  No - Patient declined    Current Medications (verified) Outpatient Encounter  Medications as of 07/02/2023  Medication Sig   amLODipine  (NORVASC ) 10 MG tablet TAKE 1 TABLET BY MOUTH DAILY   clobetasol  ointment (TEMOVATE ) 0.05 % Apply 1 Application topically 2 (two) times daily. Use sparingly for < 1 week   cyclobenzaprine  (FLEXERIL ) 10 MG tablet Take 1/2 tablet by mouth 3 times a day as needed for muscles spasm   DULoxetine  (CYMBALTA ) 60 MG capsule TAKE 1 CAPSULE BY MOUTH DAILY   gabapentin  (NEURONTIN ) 100 MG capsule Take 1 capsule (100 mg total) by mouth 3 (three) times daily.   meloxicam  (MOBIC ) 7.5 MG tablet Take 1 tablet (7.5 mg total) by mouth daily as needed for pain.   mupirocin  ointment (BACTROBAN ) 2 % Apply 1 Application topically 2 (two) times daily.   nebivolol  (BYSTOLIC ) 5 MG tablet Take 0.5 tablets (2.5 mg total) by mouth daily. TAKE 1/2 TABLET BY MOUTH DAILY   omeprazole  (PRILOSEC) 20 MG capsule Take 1 capsule (20 mg total) by mouth daily.   rosuvastatin  (CRESTOR ) 10 MG tablet TAKE 1 TABLET BY MOUTH DAILY   traZODone  (DESYREL ) 50 MG tablet TAKE 1 TABLET BY MOUTH EVERY NIGHT AT BEDTIME AS NEEDED FOR SLEEP   triamcinolone  ointment (KENALOG ) 0.5 % Apply 1 application topically 2 (two) times daily.   No facility-administered encounter medications on file as of 07/02/2023.    Allergies (verified) Codeine   History: Past Medical History:  Diagnosis Date   Arthritis    Basal cell carcinoma 06/25/2021   BCC, Left ear sup ant tragus, Mohs completed 08/18/21   Depression    GERD (  gastroesophageal reflux disease)    Hypertension    Polyp of colon    Past Surgical History:  Procedure Laterality Date   BACK SURGERY     COLONOSCOPY WITH PROPOFOL  N/A 12/13/2020   Procedure: COLONOSCOPY WITH PROPOFOL ;  Surgeon: Luke Salaam, MD;  Location: [facility] [place] ENDOSCOPY;  Service: Gastroenterology;  Laterality: N/A;   LUMBAR SPINE SURGERY  1990   Dr Verdia Glad neurosurgery   LUMBAR SPINE SURGERY  2001   Dr Jodell Munda   NECK SURGERY  1994, 1997, 2001   plate and 2 screws in  neck; 2001 cerical fusion Dr Rolinda Climes   OTHER SURGICAL HISTORY     right knee Right 1999   meniscal repair, arthoscopic   TONSILLECTOMY  1965   Family History  Problem Relation Age of Onset   Hypertension Father    Brain cancer Father 58   COPD Mother 21   Prostate cancer Brother    Prostate cancer Brother    Heart attack Neg Hx    Social History   Socioeconomic History   Marital status: Married    Spouse name: Not on file   Number of children: Not on file   Years of education: Not on file   Highest education level: Not on file  Occupational History   Not on file  Tobacco Use   Smoking status: Never   Smokeless tobacco: Never  Vaping Use   Vaping status: Never Used  Substance and Sexual Activity   Alcohol use: Yes   Drug use: Never   Sexual activity: Not Currently  Other Topics Concern   Not on file  Social History Narrative   2 girl grandchildren - 6, and 1 years   Wife is patient of mine      2003 - went on disability   Social Drivers of Corporate investment banker Strain: Low Risk  (07/02/2023)   Overall Financial Resource Strain (CARDIA)    Difficulty of Paying Living Expenses: Not hard at all  Food Insecurity: No Food Insecurity (07/02/2023)   Hunger Vital Sign    Worried About Running Out of Food in the Last Year: Never true    Ran Out of Food in the Last Year: Never true  Transportation Needs: No Transportation Needs (07/02/2023)   PRAPARE - Administrator, Civil Service (Medical): No    Lack of Transportation (Non-Medical): No  Physical Activity: Inactive (07/02/2023)   Exercise Vital Sign    Days of Exercise per Week: 0 days    Minutes of Exercise per Session: 0 min  Stress: Stress Concern Present (07/02/2023)   [employer] of Occupational Health - Occupational Stress Questionnaire    Feeling of Stress : To some extent  Social Connections: Moderately Isolated (07/02/2023)   Social Connection and Isolation Panel [NHANES]    Frequency  of Communication with Friends and Family: More than three times a week    Frequency of Social Gatherings with Friends and Family: More than three times a week    Attends Religious Services: Never    Database administrator or Organizations: No    Attends Engineer, structural: Never    Marital Status: Married    Tobacco Counseling Counseling given: Not Answered    Clinical Intake:  Pre-visit preparation completed: Yes  Pain : 0-10 Pain Score: 7  Pain Type: Chronic pain Pain Location: Back Pain Orientation: Lower Pain Descriptors / Indicators: Sharp Pain Onset: More than a month ago Pain Frequency: Constant  BMI - recorded: 26.63 Nutritional Status: BMI 25 -29 Overweight Nutritional Risks: None Diabetes: No  Lab Results  Component Value Date   HGBA1C 6.2 03/10/2023   HGBA1C 6.0 02/25/2022   HGBA1C 5.9 (A) 07/09/2021     How often do you need to have someone help you when you read instructions, pamphlets, or other written materials from your doctor or pharmacy?: 1 - Never  Interpreter Needed?: No  Information entered by :: R. Mykhia Danish LPN   Activities of Daily Living     07/02/2023   10:54 AM  In your present state of health, do you have any difficulty performing the following activities:  Hearing? 0  Comment some difficulty  Vision? 0  Comment glasses  Difficulty concentrating or making decisions? 0  Walking or climbing stairs? 1  Dressing or bathing? 0  Doing errands, shopping? 0  Preparing Food and eating ? N  Using the Toilet? N  In the past six months, have you accidently leaked urine? N  Do you have problems with loss of bowel control? N  Managing your Medications? N  Managing your Finances? N  Housekeeping or managing your Housekeeping? N    Patient Care Team: Calista Catching, FNP as PCP - General (Family Medicine)  Indicate any recent Medical Services you may have received from other than [facility] providers in the past year (date may  be approximate).     Assessment:    This is a routine wellness examination for Jeffrey Yang.  Hearing/Vision screen Hearing Screening - Comments:: Some difficulty Vision Screening - Comments:: glasses   Goals Addressed             This Visit's Progress    Patient Stated       Wants to get his back straightened out so that he can do more       Depression Screen     07/02/2023   11:01 AM 05/18/2023   12:00 PM 03/10/2023    8:39 AM 06/30/2022    1:00 PM 02/25/2022    9:12 AM 08/25/2021    9:07 AM 07/09/2021    1:36 PM  PHQ 2/9 Scores  PHQ - 2 Score 3 4 0 0 0 0 0  PHQ- 9 Score 6 6 3         Fall Risk     07/02/2023   10:56 AM 05/18/2023   11:59 AM 06/30/2022   12:58 PM 02/25/2022    9:12 AM 08/25/2021    9:07 AM  Fall Risk   Falls in the past year? 0 0 0 0 0  Number falls in past yr: 0 0 0 0 0  Injury with Fall? 0 0 0 0 0  Risk for fall due to : No Fall Risks No Fall Risks  No Fall Risks No Fall Risks  Follow up Falls prevention discussed;Falls evaluation completed Falls evaluation completed Falls prevention discussed;Falls evaluation completed Falls evaluation completed Falls evaluation completed    MEDICARE RISK AT HOME:  Medicare Risk at Home Any stairs in or around the home?: No If so, are there any without handrails?: No Home free of loose throw rugs in walkways, pet beds, electrical cords, etc?: Yes Adequate lighting in your home to reduce risk of falls?: Yes Life alert?: No Use of a cane, walker or w/c?: No Grab bars in the bathroom?: Yes Shower chair or bench in shower?: Yes Elevated toilet seat or a handicapped toilet?: No  TIMED UP AND GO:  Was the test performed?  No  Cognitive Function: 6CIT completed    08/11/2019   12:50 PM  MMSE - Mini Mental State Exam  Not completed: Unable to complete        07/02/2023   11:07 AM 06/30/2022    1:00 PM 08/11/2019    1:11 PM  6CIT Screen  What Year? 0 points 0 points 0 points  What month? 0 points 0 points 0  points  What time? 0 points 0 points 0 points  Count back from 20 0 points 0 points   Months in reverse  0 points   Repeat phrase 2 points 2 points   Total Score  2 points     Immunizations Immunization History  Administered Date(s) Administered   Fluad Quad(high Dose 65+) 10/25/2020   Influenza,inj,Quad PF,6+ Mos 11/12/2018, 01/29/2020   PFIZER(Purple Top)SARS-COV-2 Vaccination 05/12/2019, 06/06/2019, 02/03/2020   Pfizer Covid-19 Vaccine Bivalent Booster 32yrs & up 12/16/2020   Tdap 08/01/2011    Screening Tests Health Maintenance  Topic Date Due   DTaP/Tdap/Td (2 - Td or Tdap) 07/31/2021   COVID-19 Vaccine (5 - 2024-25 season) 10/18/2022   INFLUENZA VACCINE  09/17/2023   Medicare Annual Wellness (AWV)  07/01/2024   Colonoscopy  12/14/2027   Hepatitis C Screening  Completed   HPV VACCINES  Aged Out   Meningococcal B Vaccine  Aged Out   Pneumonia Vaccine 27+ Years old  Discontinued   Zoster Vaccines- Shingrix  Discontinued    Health Maintenance  Health Maintenance Due  Topic Date Due   DTaP/Tdap/Td (2 - Td or Tdap) 07/31/2021   COVID-19 Vaccine (5 - 2024-25 season) 10/18/2022   Health Maintenance Items Addressed: Discussed the need to update his tetanus vaccine. Patient declines pneumonia and shingles vaccines. Reminded patient to get a flu shot annually  Additional Screening:  Vision Screening: Recommended annual ophthalmology exams for early detection of glaucoma and other disorders of the eye.Up to date [facility]  Dental Screening: Recommended annual dental exams for proper oral hygiene  Community Resource Referral / Chronic Care Management: CRR required this visit?  No   CCM required this visit?  No   Plan:    I have personally reviewed and noted the following in the patient's chart:   Medical and social history Use of alcohol, tobacco or illicit drugs  Current medications and supplements including opioid prescriptions. Patient is not currently  taking opioid prescriptions. Functional ability and status Nutritional status Physical activity Advanced directives List of other physicians Hospitalizations, surgeries, and ER visits in previous 12 months Vitals Screenings to include cognitive, depression, and falls Referrals and appointments  In addition, I have reviewed and discussed with patient certain preventive protocols, quality metrics, and best practice recommendations. A written personalized care plan for preventive services as well as general preventive health recommendations were provided to patient.   Felicitas Horse, LPN   1/61/0960   After Visit Summary: (MyChart) Due to this being a telephonic visit, the after visit summary with patients personalized plan was offered to patient via MyChart   Notes: Nothing significant to report at this time.

## 2023-07-08 ENCOUNTER — Ambulatory Visit: Admitting: Student in an Organized Health Care Education/Training Program

## 2023-11-13 ENCOUNTER — Other Ambulatory Visit: Payer: Self-pay | Admitting: Family

## 2023-11-13 DIAGNOSIS — M545 Low back pain, unspecified: Secondary | ICD-10-CM

## 2023-12-23 ENCOUNTER — Other Ambulatory Visit: Payer: Self-pay | Admitting: Family

## 2023-12-23 DIAGNOSIS — F32A Depression, unspecified: Secondary | ICD-10-CM

## 2023-12-23 DIAGNOSIS — G4700 Insomnia, unspecified: Secondary | ICD-10-CM

## 2023-12-27 ENCOUNTER — Other Ambulatory Visit: Payer: Self-pay

## 2023-12-27 DIAGNOSIS — F32A Depression, unspecified: Secondary | ICD-10-CM

## 2023-12-27 DIAGNOSIS — G4700 Insomnia, unspecified: Secondary | ICD-10-CM

## 2023-12-27 MED ORDER — TRAZODONE HCL 50 MG PO TABS: 50.0000 mg | ORAL_TABLET | Freq: Every evening | ORAL | 3 refills | Status: AC | PRN

## 2024-02-16 ENCOUNTER — Other Ambulatory Visit: Payer: Self-pay | Admitting: Family

## 2024-02-16 DIAGNOSIS — I10 Essential (primary) hypertension: Secondary | ICD-10-CM

## 2024-02-22 ENCOUNTER — Other Ambulatory Visit: Payer: Self-pay | Admitting: Family

## 2024-02-22 DIAGNOSIS — E785 Hyperlipidemia, unspecified: Secondary | ICD-10-CM

## 2024-02-26 ENCOUNTER — Other Ambulatory Visit: Payer: Self-pay | Admitting: Family

## 2024-02-26 DIAGNOSIS — F419 Anxiety disorder, unspecified: Secondary | ICD-10-CM

## 2024-03-07 ENCOUNTER — Ambulatory Visit: Admitting: Family

## 2024-03-07 VITALS — BP 136/68 | HR 74 | Temp 97.9°F | Ht 67.0 in | Wt 171.6 lb

## 2024-03-07 DIAGNOSIS — Z23 Encounter for immunization: Secondary | ICD-10-CM

## 2024-03-07 DIAGNOSIS — Z13220 Encounter for screening for lipoid disorders: Secondary | ICD-10-CM | POA: Diagnosis not present

## 2024-03-07 DIAGNOSIS — R7303 Prediabetes: Secondary | ICD-10-CM

## 2024-03-07 DIAGNOSIS — I10 Essential (primary) hypertension: Secondary | ICD-10-CM

## 2024-03-07 DIAGNOSIS — R21 Rash and other nonspecific skin eruption: Secondary | ICD-10-CM

## 2024-03-07 DIAGNOSIS — Z125 Encounter for screening for malignant neoplasm of prostate: Secondary | ICD-10-CM

## 2024-03-07 DIAGNOSIS — E785 Hyperlipidemia, unspecified: Secondary | ICD-10-CM | POA: Diagnosis not present

## 2024-03-07 DIAGNOSIS — M545 Low back pain, unspecified: Secondary | ICD-10-CM | POA: Diagnosis not present

## 2024-03-07 DIAGNOSIS — Z136 Encounter for screening for cardiovascular disorders: Secondary | ICD-10-CM | POA: Diagnosis not present

## 2024-03-07 DIAGNOSIS — H9313 Tinnitus, bilateral: Secondary | ICD-10-CM | POA: Diagnosis not present

## 2024-03-07 MED ORDER — DULOXETINE HCL 30 MG PO CPEP: 30.0000 mg | ORAL_CAPSULE | Freq: Every day | ORAL | 3 refills | Status: AC

## 2024-03-07 NOTE — Patient Instructions (Signed)
 Increase Cymbalta  to a total dose of 90 mg/day.  If you do not find this additional 30 mgdose to be therapeutic, please return to Cymbalta  60 mg daily and let me know.  Referral to [facility]  Let us  know if you dont hear back within 2 weeks in regards to an appointment being scheduled.   So that you are aware, if you are [facility] MyChart user , please pay attention to your MyChart messages as you may receive a MyChart message with a phone number to call and schedule this test/appointment own your own from our referral coordinator. This is a new process so I do not want you to miss this message.  If you are not a MyChart user, you will receive a phone call.

## 2024-03-07 NOTE — Progress Notes (Unsigned)
 "  Assessment & Plan:  Need for influenza vaccination     Return precautions given.   Risks, benefits, and alternatives of the medications and treatment plan prescribed today were discussed, and patient expressed understanding.   Education regarding symptom management and diagnosis given to patient on AVS either electronically or printed.  No follow-ups on file.  Rollene Northern, FNP  Subjective:    Patient ID: Jeffrey Yang, male    DOB: 06-27-1955, 69 y.o.   MRN: [id number]  CC: Jeffrey Yang is a 69 y.o. male who presents today for follow up.   HPI: HPI Discussed the use of AI scribe software for clinical note transcription with the patient, who gave verbal consent to proceed.  History of Present Illness   Jeffrey Yang is a 69 year old male who presents for medication follow-up and management of chronic pain and tinnitus.  He has chronic back pain that worsened after an incident in August when he moved his wife during a seizure. The pain is persistent and has not improved since last year. He takes meloxicam  around 10 or 11 PM to manage the pain after daily activities, such as standing and playing with his grandson. He also has a prescription for Flexeril , which he uses sparingly due to its sedative effects.  He experiences tinnitus, described as a high-pitched ringing in both ears, which has been present for most of his life. He has not had a formal hearing test and is unsure about any hearing loss, though his wife comments on it. No vertigo or painful ringing. He has a history of attending many concerts, which may have contributed to his ear issues.  He is currently taking Cymbalta  for his mental health, stress, and frustration. He has been on a 60 mg dose prior to this visit. He denies any suicidal thoughts or plans.  He has a history of TMJ, which he describes as a long-standing issue with no recent changes. He has previously tried physical therapy and biofeedback without  significant improvement. He notes that he grinds his teeth frequently.      Denies pulsatile tinnitus, vertigo Denies urinary hesitancy, decreased urine stream.   Allergies: Codeine Medications Ordered Prior to Encounter[1]  Review of Systems    Objective:    BP 136/68   Pulse 74   Temp 97.9 F (36.6 C) (Oral)   Ht 5' 7 (1.702 m)   Wt 171 lb 9.6 oz (77.8 kg)   SpO2 97%   BMI 26.88 kg/m  BP Readings from Last 3 Encounters:  03/07/24 136/68  05/18/23 134/68  03/10/23 138/78   Wt Readings from Last 3 Encounters:  03/07/24 171 lb 9.6 oz (77.8 kg)  07/02/23 170 lb (77.1 kg)  05/18/23 175 lb 3.2 oz (79.5 kg)    Physical Exam        [1]  Current Outpatient Medications on File Prior to Visit  Medication Sig Dispense Refill   amLODipine  (NORVASC ) 10 MG tablet TAKE 1 TABLET BY MOUTH DAILY 90 tablet 0   clobetasol  ointment (TEMOVATE ) 0.05 % Apply 1 Application topically 2 (two) times daily. Use sparingly for < 1 week 30 g 0   cyclobenzaprine  (FLEXERIL ) 10 MG tablet Take 1/2 tablet by mouth 3 times a day as needed for muscles spasm 30 tablet 1   DULoxetine  (CYMBALTA ) 60 MG capsule TAKE 1 CAPSULE BY MOUTH DAILY 90 capsule 3   gabapentin  (NEURONTIN ) 100 MG capsule TAKE 1 CAPSULE BY MOUTH 3 TIMES A DAY  90 capsule 3   meloxicam  (MOBIC ) 7.5 MG tablet Take 1 tablet (7.5 mg total) by mouth daily as needed for pain. 30 tablet 2   mupirocin  ointment (BACTROBAN ) 2 % Apply 1 Application topically 2 (two) times daily. 22 g 2   nebivolol  (BYSTOLIC ) 5 MG tablet Take 0.5 tablets (2.5 mg total) by mouth daily. TAKE 1/2 TABLET BY MOUTH DAILY 45 tablet 3   omeprazole  (PRILOSEC) 20 MG capsule Take 1 capsule (20 mg total) by mouth daily. 90 capsule 3   rosuvastatin  (CRESTOR ) 10 MG tablet TAKE 1 TABLET BY MOUTH DAILY 90 tablet 3   traZODone  (DESYREL ) 50 MG tablet Take 1 tablet (50 mg total) by mouth at bedtime as needed. for sleep 90 tablet 3   triamcinolone  ointment (KENALOG ) 0.5 % Apply 1  application topically 2 (two) times daily. 30 g 0   No current facility-administered medications on file prior to visit.   "

## 2024-03-08 ENCOUNTER — Other Ambulatory Visit

## 2024-03-08 DIAGNOSIS — R21 Rash and other nonspecific skin eruption: Secondary | ICD-10-CM | POA: Diagnosis not present

## 2024-03-08 DIAGNOSIS — I10 Essential (primary) hypertension: Secondary | ICD-10-CM | POA: Diagnosis not present

## 2024-03-08 DIAGNOSIS — Z125 Encounter for screening for malignant neoplasm of prostate: Secondary | ICD-10-CM | POA: Diagnosis not present

## 2024-03-08 DIAGNOSIS — E785 Hyperlipidemia, unspecified: Secondary | ICD-10-CM

## 2024-03-08 DIAGNOSIS — Z13220 Encounter for screening for lipoid disorders: Secondary | ICD-10-CM

## 2024-03-08 DIAGNOSIS — R7303 Prediabetes: Secondary | ICD-10-CM

## 2024-03-08 LAB — COMPREHENSIVE METABOLIC PANEL WITH GFR
ALT: 13 U/L (ref 3–53)
AST: 17 U/L (ref 5–37)
Albumin: 4.8 g/dL (ref 3.5–5.2)
Alkaline Phosphatase: 92 U/L (ref 39–117)
BUN: 17 mg/dL (ref 6–23)
CO2: 30 meq/L (ref 19–32)
Calcium: 10.1 mg/dL (ref 8.4–10.5)
Chloride: 101 meq/L (ref 96–112)
Creatinine, Ser: 1.11 mg/dL (ref 0.40–1.50)
GFR: 68.28 mL/min
Glucose, Bld: 119 mg/dL — ABNORMAL HIGH (ref 70–99)
Potassium: 4.8 meq/L (ref 3.5–5.1)
Sodium: 137 meq/L (ref 135–145)
Total Bilirubin: 0.5 mg/dL (ref 0.2–1.2)
Total Protein: 7.1 g/dL (ref 6.0–8.3)

## 2024-03-08 LAB — LIPID PANEL
Cholesterol: 146 mg/dL (ref 28–200)
HDL: 60.4 mg/dL
LDL Cholesterol: 68 mg/dL (ref 10–99)
NonHDL: 85.62
Total CHOL/HDL Ratio: 2
Triglycerides: 89 mg/dL (ref 10.0–149.0)
VLDL: 17.8 mg/dL (ref 0.0–40.0)

## 2024-03-08 LAB — CBC WITH DIFFERENTIAL/PLATELET
Basophils Absolute: 0 K/uL (ref 0.0–0.1)
Basophils Relative: 0.5 % (ref 0.0–3.0)
Eosinophils Absolute: 0.2 K/uL (ref 0.0–0.7)
Eosinophils Relative: 2.5 % (ref 0.0–5.0)
HCT: 49.3 % (ref 39.0–52.0)
Hemoglobin: 16.7 g/dL (ref 13.0–17.0)
Lymphocytes Relative: 25.2 % (ref 12.0–46.0)
Lymphs Abs: 2.2 K/uL (ref 0.7–4.0)
MCHC: 33.9 g/dL (ref 30.0–36.0)
MCV: 93.6 fl (ref 78.0–100.0)
Monocytes Absolute: 0.7 K/uL (ref 0.1–1.0)
Monocytes Relative: 7.6 % (ref 3.0–12.0)
Neutro Abs: 5.6 K/uL (ref 1.4–7.7)
Neutrophils Relative %: 64.2 % (ref 43.0–77.0)
Platelets: 230 K/uL (ref 150.0–400.0)
RBC: 5.26 Mil/uL (ref 4.22–5.81)
RDW: 13.2 % (ref 11.5–15.5)
WBC: 8.7 K/uL (ref 4.0–10.5)

## 2024-03-08 LAB — TSH: TSH: 1.5 u[IU]/mL (ref 0.35–5.50)

## 2024-03-08 LAB — PSA: PSA: 1.94 ng/mL (ref 0.10–4.00)

## 2024-03-08 LAB — VITAMIN D 25 HYDROXY (VIT D DEFICIENCY, FRACTURES): VITD: 46.11 ng/mL (ref 30.00–100.00)

## 2024-03-08 LAB — HEMOGLOBIN A1C: Hgb A1c MFr Bld: 5.8 % (ref 4.6–6.5)

## 2024-03-09 ENCOUNTER — Ambulatory Visit: Payer: Self-pay | Admitting: Family

## 2024-03-09 DIAGNOSIS — H9319 Tinnitus, unspecified ear: Secondary | ICD-10-CM | POA: Insufficient documentation

## 2024-03-09 NOTE — Assessment & Plan Note (Addendum)
 Chronic, recently exacerbated.  Overall suboptimal control.   Increased Cymbalta  to 90 mg daily by adding a 30 mg dose. Discussed doses of cymbalta  > 60mg  may not confer therapeutic benefit.  If he does not find therapeutic benefit on higher dose, he understands to return to Cymbalta  60 mg daily .  Referral to pain management for further evaluation, consideration of procedure.

## 2024-03-09 NOTE — Assessment & Plan Note (Signed)
 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 Referred to [facility] for evaluation and auditory evaluation. Recommended white noise/ music

## 2024-03-09 NOTE — Assessment & Plan Note (Signed)
Chronic, stable.  Continue Bystolic 2.5 mg, amlodipine 10 mg daily .

## 2024-03-10 ENCOUNTER — Encounter: Payer: Self-pay | Admitting: Family

## 2024-03-20 ENCOUNTER — Other Ambulatory Visit: Payer: Self-pay | Admitting: Family

## 2024-03-20 DIAGNOSIS — M545 Low back pain, unspecified: Secondary | ICD-10-CM

## 2024-07-05 ENCOUNTER — Ambulatory Visit

## 2024-07-18 ENCOUNTER — Ambulatory Visit: Admitting: Family
